# Patient Record
Sex: Female | Born: 1955
Health system: Southern US, Community
[De-identification: ages and names within clinical notes are randomized; demographics above are authoritative.]

## PROBLEM LIST (undated history)

## (undated) DIAGNOSIS — I10 Essential (primary) hypertension: Secondary | ICD-10-CM

## (undated) DIAGNOSIS — F32A Depression, unspecified: Secondary | ICD-10-CM

## (undated) DIAGNOSIS — F329 Major depressive disorder, single episode, unspecified: Secondary | ICD-10-CM

## (undated) HISTORY — PX: TONSILLECTOMY: SUR1361

## (undated) HISTORY — PX: BREAST SURGERY: SHX581

## (undated) HISTORY — PX: DILATION AND CURETTAGE OF UTERUS: SHX78

---

## 2004-07-29 ENCOUNTER — Emergency Department: Payer: Self-pay | Admitting: Emergency Medicine

## 2005-02-18 ENCOUNTER — Emergency Department: Payer: Self-pay | Admitting: Emergency Medicine

## 2007-01-14 ENCOUNTER — Emergency Department: Payer: Self-pay | Admitting: Emergency Medicine

## 2007-09-13 ENCOUNTER — Ambulatory Visit: Payer: Self-pay

## 2015-07-25 ENCOUNTER — Other Ambulatory Visit: Payer: Self-pay | Admitting: Nurse Practitioner

## 2015-07-25 DIAGNOSIS — Z1231 Encounter for screening mammogram for malignant neoplasm of breast: Secondary | ICD-10-CM

## 2015-08-25 ENCOUNTER — Ambulatory Visit: Payer: Medicare Other | Attending: Nurse Practitioner

## 2015-08-26 ENCOUNTER — Ambulatory Visit
Admission: RE | Admit: 2015-08-26 | Discharge: 2015-08-26 | Disposition: A | Discharge status: Home / Self Care | Payer: Medicare Other | Source: Ambulatory Visit | Attending: Nurse Practitioner | Admitting: Nurse Practitioner

## 2015-08-26 ENCOUNTER — Other Ambulatory Visit: Payer: Self-pay | Admitting: Nurse Practitioner

## 2015-08-26 DIAGNOSIS — F17200 Nicotine dependence, unspecified, uncomplicated: Secondary | ICD-10-CM | POA: Insufficient documentation

## 2015-08-26 DIAGNOSIS — R0902 Hypoxemia: Secondary | ICD-10-CM | POA: Insufficient documentation

## 2015-08-26 DIAGNOSIS — R918 Other nonspecific abnormal finding of lung field: Secondary | ICD-10-CM | POA: Diagnosis not present

## 2015-08-26 DIAGNOSIS — R600 Localized edema: Secondary | ICD-10-CM | POA: Insufficient documentation

## 2015-10-15 DIAGNOSIS — E669 Obesity, unspecified: Secondary | ICD-10-CM | POA: Insufficient documentation

## 2015-10-15 DIAGNOSIS — Z9981 Dependence on supplemental oxygen: Secondary | ICD-10-CM

## 2015-10-15 DIAGNOSIS — R0902 Hypoxemia: Secondary | ICD-10-CM | POA: Insufficient documentation

## 2015-10-15 DIAGNOSIS — I071 Rheumatic tricuspid insufficiency: Secondary | ICD-10-CM | POA: Insufficient documentation

## 2016-08-09 ENCOUNTER — Other Ambulatory Visit: Payer: Self-pay | Admitting: Nurse Practitioner

## 2016-08-09 DIAGNOSIS — Z1231 Encounter for screening mammogram for malignant neoplasm of breast: Secondary | ICD-10-CM

## 2017-05-19 ENCOUNTER — Other Ambulatory Visit: Payer: Self-pay | Admitting: Nurse Practitioner

## 2017-05-19 DIAGNOSIS — Z1231 Encounter for screening mammogram for malignant neoplasm of breast: Secondary | ICD-10-CM

## 2017-05-31 ENCOUNTER — Other Ambulatory Visit: Payer: Self-pay

## 2017-05-31 ENCOUNTER — Telehealth: Payer: Self-pay

## 2017-05-31 DIAGNOSIS — Z1211 Encounter for screening for malignant neoplasm of colon: Secondary | ICD-10-CM

## 2017-05-31 NOTE — Telephone Encounter (Signed)
Gastroenterology Pre-Procedure Review  Request Date:   Requesting Physician: Dr.    PATIENT REVIEW QUESTIONS: The patient responded to the following health history questions as indicated:    1. Are you having any GI issues? No  2. Do you have a personal history of Polyps? No  3. Do you have a family history of Colon Cancer or Polyps? No  4. Diabetes Mellitus? No  5. Joint replacements in the past 12 months? No  6. Major health problems in the past 3 months? No  7. Any artificial heart valves, MVP, or defibrillator? No     MEDICATIONS & ALLERGIES:    Patient reports the following regarding taking any anticoagulation/antiplatelet therapy:   Plavix, Coumadin, Eliquis, Xarelto, Lovenox, Pradaxa, Brilinta, or Effient? No  Aspirin? No   Patient confirms/reports the following medications:  Current Outpatient Medications  Medication Sig Dispense Refill  . albuterol (PROVENTIL HFA;VENTOLIN HFA) 108 (90 Base) MCG/ACT inhaler Inhale 2 puffs into the lungs every 4 (four) hours as needed for wheezing or shortness of breath.    . Buprenorphine HCl-Naloxone HCl (SUBOXONE) 8-2 MG FILM Take 1 Film by mouth daily.    Marland Kitchen FLUoxetine (PROZAC) 20 MG capsule Take 60 mg by mouth daily.    . furosemide (LASIX) 20 MG tablet Take 20 mg by mouth.    Marland Kitchen lisinopril (PRINIVIL,ZESTRIL) 5 MG tablet Take 5 mg by mouth daily.    . potassium chloride (KLOR-CON) 20 MEQ packet Take 20 mEq by mouth once.    . traZODone (DESYREL) 100 MG tablet Take 100 mg by mouth.    . zolpidem (AMBIEN) 10 MG tablet Take 20 mg by mouth at bedtime as needed.      No current facility-administered medications for this visit.     Patient confirms/reports the following allergies:  No Known Allergies  No orders of the defined types were placed in this encounter.   AUTHORIZATION INFORMATION Primary Insurance: 1D#: Group #:  Secondary Insurance: 1D#: Group #:  SCHEDULE INFORMATION: Date: 06/15/17 Time: Location: [place]

## 2017-06-15 ENCOUNTER — Ambulatory Visit: Payer: Medicare Other | Admitting: Anesthesiology

## 2017-06-15 ENCOUNTER — Ambulatory Visit
Admission: RE | Admit: 2017-06-15 | Discharge: 2017-06-15 | Disposition: A | Discharge status: Home / Self Care | Payer: Medicare Other | Source: Ambulatory Visit | Attending: Gastroenterology | Admitting: Gastroenterology

## 2017-06-15 ENCOUNTER — Encounter: Payer: Self-pay | Admitting: *Deleted

## 2017-06-15 ENCOUNTER — Encounter
Admission: RE | Disposition: A | Discharge status: Home / Self Care | Payer: Self-pay | Source: Ambulatory Visit | Attending: Gastroenterology

## 2017-06-15 ENCOUNTER — Other Ambulatory Visit: Payer: Self-pay

## 2017-06-15 DIAGNOSIS — K6389 Other specified diseases of intestine: Secondary | ICD-10-CM | POA: Diagnosis not present

## 2017-06-15 DIAGNOSIS — D123 Benign neoplasm of transverse colon: Secondary | ICD-10-CM | POA: Diagnosis not present

## 2017-06-15 DIAGNOSIS — Z1211 Encounter for screening for malignant neoplasm of colon: Secondary | ICD-10-CM | POA: Insufficient documentation

## 2017-06-15 DIAGNOSIS — Z79899 Other long term (current) drug therapy: Secondary | ICD-10-CM | POA: Diagnosis not present

## 2017-06-15 DIAGNOSIS — Z6831 Body mass index (BMI) 31.0-31.9, adult: Secondary | ICD-10-CM | POA: Diagnosis not present

## 2017-06-15 DIAGNOSIS — Z7951 Long term (current) use of inhaled steroids: Secondary | ICD-10-CM | POA: Diagnosis not present

## 2017-06-15 DIAGNOSIS — F329 Major depressive disorder, single episode, unspecified: Secondary | ICD-10-CM | POA: Insufficient documentation

## 2017-06-15 DIAGNOSIS — D122 Benign neoplasm of ascending colon: Secondary | ICD-10-CM | POA: Diagnosis not present

## 2017-06-15 DIAGNOSIS — K5730 Diverticulosis of large intestine without perforation or abscess without bleeding: Secondary | ICD-10-CM

## 2017-06-15 DIAGNOSIS — K635 Polyp of colon: Secondary | ICD-10-CM

## 2017-06-15 DIAGNOSIS — E669 Obesity, unspecified: Secondary | ICD-10-CM | POA: Insufficient documentation

## 2017-06-15 DIAGNOSIS — D125 Benign neoplasm of sigmoid colon: Secondary | ICD-10-CM | POA: Diagnosis not present

## 2017-06-15 DIAGNOSIS — I10 Essential (primary) hypertension: Secondary | ICD-10-CM | POA: Insufficient documentation

## 2017-06-15 DIAGNOSIS — F17200 Nicotine dependence, unspecified, uncomplicated: Secondary | ICD-10-CM | POA: Diagnosis not present

## 2017-06-15 HISTORY — DX: Major depressive disorder, single episode, unspecified: F32.9

## 2017-06-15 HISTORY — PX: COLONOSCOPY WITH PROPOFOL: SHX5780

## 2017-06-15 HISTORY — DX: Depression, unspecified: F32.A

## 2017-06-15 HISTORY — DX: Essential (primary) hypertension: I10

## 2017-06-15 SURGERY — COLONOSCOPY WITH PROPOFOL
Anesthesia: General

## 2017-06-15 MED ORDER — PROPOFOL 500 MG/50ML IV EMUL
INTRAVENOUS | Status: DC | PRN
  Administered 2017-06-15: 160 ug/kg/min via INTRAVENOUS

## 2017-06-15 MED ORDER — EPHEDRINE SULFATE 50 MG/ML IJ SOLN
INTRAMUSCULAR | Status: DC | PRN
  Administered 2017-06-15: 5 mg via INTRAVENOUS

## 2017-06-15 MED ORDER — SPOT INK MARKER SYRINGE KIT
PACK | SUBMUCOSAL | Status: DC | PRN
  Administered 2017-06-15: 2 mL via SUBMUCOSAL

## 2017-06-15 MED ORDER — PROPOFOL 10 MG/ML IV BOLUS
INTRAVENOUS | Status: DC | PRN
  Administered 2017-06-15: 70 mg via INTRAVENOUS

## 2017-06-15 MED ORDER — SODIUM CHLORIDE 0.9 % IV SOLN
INTRAVENOUS | Status: DC
  Administered 2017-06-15: 1000 mL via INTRAVENOUS

## 2017-06-15 NOTE — Anesthesia Post-op Follow-up Note (Signed)
Anesthesia QCDR form completed.        

## 2017-06-15 NOTE — Anesthesia Preprocedure Evaluation (Signed)
Anesthesia Evaluation  Patient identified by MRN, date of birth, ID band Patient awake    Reviewed: Allergy & Precautions, NPO status , Patient's Chart, lab work & pertinent test results  History of Anesthesia Complications Negative for: history of anesthetic complications  Airway Mallampati: III  TM Distance: >3 FB Neck ROM: Full    Dental  (+) Upper Dentures, Lower Dentures   Pulmonary neg sleep apnea, COPD,  COPD inhaler, Current Smoker,    breath sounds clear to auscultation- rhonchi (-) wheezing      Cardiovascular Exercise Tolerance: Good hypertension, Pt. on medications (-) CAD, (-) Past MI, (-) Cardiac Stents and (-) CABG  Rhythm:Regular Rate:Normal - Systolic murmurs and - Diastolic murmurs    Neuro/Psych PSYCHIATRIC DISORDERS Depression negative neurological ROS     GI/Hepatic negative GI ROS, Neg liver ROS,   Endo/Other  negative endocrine ROSneg diabetes  Renal/GU negative Renal ROS     Musculoskeletal negative musculoskeletal ROS (+)   Abdominal (+) + obese,   Peds  Hematology negative hematology ROS (+)   Anesthesia Other Findings Past Medical History: No date: Depression No date: Hypertension   Reproductive/Obstetrics                             Anesthesia Physical Anesthesia Plan  ASA: II  Anesthesia Plan: General   Post-op Pain Management:    Induction: Intravenous  PONV Risk Score and Plan: 1 and Propofol infusion  Airway Management Planned: Natural Airway  Additional Equipment:   Intra-op Plan:   Post-operative Plan:   Informed Consent: I have reviewed the patients History and Physical, chart, labs and discussed the procedure including the risks, benefits and alternatives for the proposed anesthesia with the patient or authorized representative who has indicated his/her understanding and acceptance.   Dental advisory given  Plan Discussed with: CRNA  and Anesthesiologist  Anesthesia Plan Comments:         Anesthesia Quick Evaluation

## 2017-06-15 NOTE — Op Note (Addendum)
[facility] Patient Name: Lakiyah Arntson Procedure Date: 06/15/2017 11:04 AM MRN: [id number] Account #: [id number] Date of Birth: 1955-10-12 Admit Type: Outpatient Age: 62 Room: [facility] ENDO ROOM 2 Gender: Female Note Status: Finalized Procedure:            Colonoscopy Indications:          Screening for colorectal malignant neoplasm Providers:            Jawon Dipiero B. Bonna Gains MD, MD Referring MD:         Forest Gleason Md, MD (Referring MD) Medicines:            Monitored Anesthesia Care Complications:        No immediate complications. Procedure:            Pre-Anesthesia Assessment:                       - ASA Grade Assessment: II - A patient with mild                        systemic disease.                       - Prior to the procedure, a History and Physical was                        performed, and patient medications, allergies and                        sensitivities were reviewed. The patient's tolerance of                        previous anesthesia was reviewed.                       - The risks and benefits of the procedure and the                        sedation options and risks were discussed with the                        patient. All questions were answered and informed                        consent was obtained.                       - Patient identification and proposed procedure were                        verified prior to the procedure by the physician, the                        nurse, the anesthesiologist, the anesthetist and the                        technician. The procedure was verified in the procedure                        room.  After obtaining informed consent, the colonoscope was                        passed under direct vision. Throughout the procedure,                        the patient's blood pressure, pulse, and oxygen                        saturations were monitored continuously. The                  Colonoscope was introduced through the anus and                        advanced to the the cecum, identified by appendiceal                        orifice and ileocecal valve. The colonoscopy was                        performed with ease. The patient tolerated the                        procedure well. The quality of the bowel preparation                        was good. Findings:      The perianal and digital rectal examinations were normal.      A 5 mm polyp was found in the ascending colon. The polyp was flat. The       polyp was removed with a jumbo cold forceps. Resection and retrieval       were complete. Area was tattooed with an injection of 2 mL of Spot       (carbon black).      A 3 mm polyp was found in the transverse colon. The polyp was sessile.       The polyp was removed with a jumbo cold forceps. Resection and retrieval       were complete.      A 4 mm polyp was found in the sigmoid colon. The polyp was sessile. The       polyp was removed with a cold snare. Resection and retrieval were       complete.      A localized area of mildly erythematous and thickened folds of the       mucosa was found in the sigmoid colon. Biopsies were taken with a cold       forceps for histology.      Multiple small-mouthed diverticula were found in the sigmoid colon.      The exam was otherwise without abnormality.      The retroflexed view of the distal rectum and anal verge was normal and       showed no anal or rectal abnormalities. Impression:           - One 5 mm polyp in the ascending colon, removed with a                        jumbo cold forceps. Resected and retrieved. Tattooed.                       -  One 3 mm polyp in the transverse colon, removed with                        a jumbo cold forceps. Resected and retrieved.                       - One 4 mm polyp in the sigmoid colon, removed with a                        cold snare. Resected and retrieved.                        - Erythematous and thickened folds of the mucosa in the                        sigmoid colon. Biopsied.                       - Diverticulosis in the sigmoid colon.                       - The examination was otherwise normal.                       - The distal rectum and anal verge are normal on                        retroflexion view. Recommendation:       - Discharge patient to home (with escort).                       - Advance diet as tolerated.                       - Continue present medications.                       - Await pathology results.                       - Repeat colonoscopy in 3 - 5 years for surveillance.                        Patient will be updated after biopsy results.                       - The findings and recommendations were discussed with                        the patient.                       - The findings and recommendations were discussed with                        the patient's family.                       - Return to primary care physician as previously                        scheduled.                       -  High fiber diet. Procedure Code(s):    --- Professional ---                       [phone], Colonoscopy, flexible; with removal of tumor(s),                        polyp(s), or other lesion(s) by snare technique                       [id number], Colonoscopy, flexible; with directed submucosal                        injection(s), any substance                       45409, 34, Colonoscopy, flexible; with biopsy, single                        or multiple Diagnosis Code(s):    --- Professional ---                       Z12.11, Encounter for screening for malignant neoplasm                        of colon                       D12.2, Benign neoplasm of ascending colon                       D12.3, Benign neoplasm of transverse colon (hepatic                        flexure or splenic flexure)                       D12.5, Benign neoplasm of  sigmoid colon                       K63.89, Other specified diseases of intestine                       K57.30, Diverticulosis of large intestine without                        perforation or abscess without bleeding CPT copyright 2016 American Medical Association. All rights reserved. The codes documented in this report are preliminary and upon coder review may  be revised to meet current compliance requirements.  Vonda Antigua, MD Margretta Sidle B. Bonna Gains MD, MD 06/15/2017 12:12:44 PM This report has been signed electronically. Number of Addenda: 0 Note Initiated On: 06/15/2017 11:04 AM Scope Withdrawal Time: 0 hours 37 minutes 35 seconds  Total Procedure Duration: 0 hours 49 minutes 45 seconds  Estimated Blood Loss: Estimated blood loss: none.      [facility]

## 2017-06-15 NOTE — Transfer of Care (Signed)
Immediate Anesthesia Transfer of Care Note  Patient: Andrea Hobbs  Procedure(s) Performed: COLONOSCOPY WITH PROPOFOL (N/A )  Patient Location: PACU  Anesthesia Type:General  Level of Consciousness: awake, alert  and oriented  Airway & Oxygen Therapy: Patient Spontanous Breathing  Post-op Assessment: Report given to RN and Post -op Vital signs reviewed and stable  Post vital signs: Reviewed and stable  Last Vitals:  Vitals:   06/15/17 1040 06/15/17 1211  BP: 104/71   Pulse: (!) 59 62  Resp: 18 15  Temp: (!) 36.2 C (!) 36.3 C  SpO2: 99% 94%    Last Pain:  Vitals:   06/15/17 1040  TempSrc: Tympanic         Complications: No apparent anesthesia complications

## 2017-06-15 NOTE — H&P (Signed)
Andrea Antigua, MD [address] [place], [place], [place], [address] [address], [place], [place], [place], [address] Phone: [phone]  Fax: [phone]  Primary Care Physician:  [facility] [place]   Pre-Procedure History & Physical: HPI:  Andrea Hobbs is a 62 y.o. female is here for a colonoscopy.   Past Medical History:  Diagnosis Date  . Depression   . Hypertension     Past Surgical History:  Procedure Laterality Date  . BREAST SURGERY Right    benign breast lump removed  . DILATION AND CURETTAGE OF UTERUS    . TONSILLECTOMY      Prior to Admission medications   Medication Sig Start Date End Date Taking? Authorizing Provider  albuterol (PROVENTIL HFA;VENTOLIN HFA) 108 (90 Base) MCG/ACT inhaler Inhale 2 puffs into the lungs every 4 (four) hours as needed for wheezing or shortness of breath.   Yes [provider]  Buprenorphine HCl-Naloxone HCl (SUBOXONE) 8-2 MG FILM Take 1 Film by mouth daily. 03/21/15  Yes [provider]  FLUoxetine (PROZAC) 20 MG capsule Take 60 mg by mouth daily. 01/03/15  Yes [provider]  furosemide (LASIX) 20 MG tablet Take 20 mg by mouth.   Yes [provider]  lisinopril (PRINIVIL,ZESTRIL) 5 MG tablet Take 5 mg by mouth daily.   Yes [provider]  potassium chloride (KLOR-CON) 20 MEQ packet Take 20 mEq by mouth once.   Yes [provider]  traZODone (DESYREL) 100 MG tablet Take 100 mg by mouth. 12/08/10  Yes [provider]  zolpidem (AMBIEN) 10 MG tablet Take 20 mg by mouth at bedtime as needed.  11/23/14  Yes [provider]    Allergies as of 05/31/2017  . (No Known Allergies)    History reviewed. No pertinent family history.  Social History   Socioeconomic History  . Marital status: Divorced    Spouse name: Not on file  . Number of children: Not on file  . Years of education: Not on file  . Highest education level: Not on file  Social  Needs  . Financial resource strain: Not on file  . Food insecurity - worry: Not on file  . Food insecurity - inability: Not on file  . Transportation needs - medical: Not on file  . Transportation needs - non-medical: Not on file  Occupational History  . Not on file  Tobacco Use  . Smoking status: Current Every Day Smoker  . Smokeless tobacco: Never Used  Substance and Sexual Activity  . Alcohol use: No    Frequency: Never  . Drug use: No  . Sexual activity: Not on file  Other Topics Concern  . Not on file  Social History Narrative  . Not on file    Review of Systems: See HPI, otherwise negative ROS  Physical Exam: BP 104/71   Pulse (!) 59   Temp (!) 97.2 F (36.2 C) (Tympanic)   Resp 18   Ht 5\' 3"  (1.6 m)   Wt 175 lb (79.4 kg)   SpO2 99%   BMI 31.00 kg/m  General:   Alert,  pleasant and cooperative in NAD Head:  Normocephalic and atraumatic. Neck:  Supple; no masses or thyromegaly. Lungs:  Clear throughout to auscultation, normal respiratory effort.    Heart:  +S1, +S2, Regular rate and rhythm, No edema. Abdomen:  Soft, nontender and nondistended. Normal bowel sounds, without guarding, and without rebound.   Neurologic:  Alert and  oriented x4;  grossly normal  neurologically.  Impression/Plan: Andrea Hobbs is here for a colonoscopy to be performed for average risk screening.  Risks, benefits, limitations, and alternatives regarding  colonoscopy have been reviewed with the patient.  Questions have been answered.  All parties agreeable.   Virgel Manifold, MD  06/15/2017, 10:47 AM

## 2017-06-15 NOTE — Anesthesia Postprocedure Evaluation (Signed)
Anesthesia Post Note  Patient: Anayelli Galik  Procedure(s) Performed: COLONOSCOPY WITH PROPOFOL (N/A )  Patient location during evaluation: Endoscopy Anesthesia Type: General Level of consciousness: awake and alert and oriented Pain management: pain level controlled Vital Signs Assessment: post-procedure vital signs reviewed and stable Respiratory status: spontaneous breathing, nonlabored ventilation and respiratory function stable Cardiovascular status: blood pressure returned to baseline and stable Postop Assessment: no signs of nausea or vomiting Anesthetic complications: no     Last Vitals:  Vitals:   06/15/17 1211 06/15/17 1227  BP:    Pulse: 62   Resp: 15   Temp: (!) 36.3 C   SpO2: 94% 90%    Last Pain:  Vitals:   06/15/17 1207  TempSrc: Tympanic                 Bridgid Printz

## 2017-06-16 ENCOUNTER — Encounter: Payer: Self-pay | Admitting: Gastroenterology

## 2017-06-17 LAB — SURGICAL PATHOLOGY

## 2017-07-11 ENCOUNTER — Ambulatory Visit
Admission: RE | Admit: 2017-07-11 | Discharge: 2017-07-11 | Disposition: A | Discharge status: Home / Self Care | Payer: Medicare Other | Attending: Family Medicine | Admitting: Family Medicine

## 2017-07-27 ENCOUNTER — Ambulatory Visit (INDEPENDENT_AMBULATORY_CARE_PROVIDER_SITE_OTHER): Payer: Medicare Other | Admitting: Gastroenterology

## 2017-07-27 ENCOUNTER — Encounter: Payer: Self-pay | Admitting: Gastroenterology

## 2017-07-27 ENCOUNTER — Encounter (INDEPENDENT_AMBULATORY_CARE_PROVIDER_SITE_OTHER): Payer: Self-pay

## 2017-07-27 VITALS — BP 102/63 | HR 82 | Temp 98.1°F | Wt 174.8 lb

## 2017-07-27 DIAGNOSIS — B182 Chronic viral hepatitis C: Secondary | ICD-10-CM

## 2017-07-27 DIAGNOSIS — Z86010 Personal history of colonic polyps: Secondary | ICD-10-CM

## 2017-07-27 NOTE — Progress Notes (Signed)
Vonda Antigua, MD [address]  [place]  [place], [place] [address]  Main: [phone]  Fax: [phone]   Primary Care Physician: [facility]  Primary Gastroenterologist:  Dr. Vonda Antigua  Chief complaint: Follow-up of colonoscopy and biopsies  HPI: Andrea Hobbs is a 62 y.o. femalePatient had an average risk screening colonoscopy on June 15, 2017 when she was referred by her primary care provider.  As detailed below, 3 polyps were removed completely from her colon.  The one in the ascending colon was tattooed as it was removed piecemeal via jumbo cold forceps.  Biopsies of the polyps Showed tubular adenoma.  There was a thickened erythematous fold in the sigmoid colon that was biopsied.  The biopsies of which are showing sessile serrated adenoma.  Patient is here to discuss biopsy results and need for repeat colonoscopy.  No abdominal pain, no blood in stool, no nausea vomiting or weight loss.  Has history of hepatitis C, treated by [facility] in the past.  No recent drug use.  Impression:           - One 5 mm polyp in the ascending colon, removed with a                        jumbo cold forceps. Resected and retrieved. Tattooed.                       - One 3 mm polyp in the transverse colon, removed with                        a jumbo cold forceps. Resected and retrieved.                       - One 4 mm polyp in the sigmoid colon, removed with a                        cold snare. Resected and retrieved.                       - Erythematous and thickened folds of the mucosa in the                        sigmoid colon. Biopsied.                       - Diverticulosis in the sigmoid colon.                       - The examination was otherwise normal.                       - The distal rectum and anal verge are normal on                        retroflexion view.   DIAGNOSIS:  A. COLON POLYP, ASCENDING; COLD BIOPSY:  - TUBULAR ADENOMA, 4  FRAGMENTS.  - NEGATIVE FOR HIGH-GRADE DYSPLASIA AND MALIGNANCY.   B. COLON POLYP, TRANSVERSE AND SIGMOID; COLD BIOPSY:  - TUBULAR ADENOMAS, 2 FRAGMENTS.  - NEGATIVE FOR HIGH-GRADE DYSPLASIA AND MALIGNANCY.   C. COLON POLYP VS LESION, SIGMOID; COLD BIOPSY:  - SESSILE SERRATED ADENOMA.  -  NEGATIVE FOR CYTOLOGIC DYSPLASIA AND MALIGNANCY.   Current Outpatient Medications  Medication Sig Dispense Refill  . albuterol (PROVENTIL HFA;VENTOLIN HFA) 108 (90 Base) MCG/ACT inhaler Inhale 2 puffs into the lungs every 4 (four) hours as needed for wheezing or shortness of breath.    . Buprenorphine HCl-Naloxone HCl (SUBOXONE) 8-2 MG FILM Take 1 Film by mouth daily.    Marland Kitchen FLUoxetine (PROZAC) 20 MG capsule Take 60 mg by mouth daily.    . furosemide (LASIX) 20 MG tablet Take 20 mg by mouth.    Marland Kitchen lisinopril (PRINIVIL,ZESTRIL) 5 MG tablet Take 5 mg by mouth daily.    . potassium chloride (KLOR-CON) 20 MEQ packet Take 20 mEq by mouth once.    . traZODone (DESYREL) 100 MG tablet Take 100 mg by mouth.    . zolpidem (AMBIEN) 10 MG tablet Take 20 mg by mouth at bedtime as needed.      No current facility-administered medications for this visit.     Allergies as of 07/27/2017  . (No Known Allergies)    ROS:  General: Negative for anorexia, weight loss, fever, chills, fatigue, weakness. ENT: Negative for hoarseness, difficulty swallowing , nasal congestion. CV: Negative for chest pain, angina, palpitations, dyspnea on exertion, peripheral edema.  Respiratory: Negative for dyspnea at rest, dyspnea on exertion, cough, sputum, wheezing.  GI: See history of present illness. GU:  Negative for dysuria, hematuria, urinary incontinence, urinary frequency, nocturnal urination.  Endo: Negative for unusual weight change.    Physical Examination:   There were no vitals taken for this visit.  General: Well-nourished, well-developed in no acute distress.  Eyes: No icterus. Conjunctivae pink. Mouth: Oropharyngeal  mucosa moist and pink , no lesions erythema or exudate. Neck: Supple, Trachea midline Abdomen: Bowel sounds are normal, nontender, nondistended, no hepatosplenomegaly or masses, no abdominal bruits or hernia , no rebound or guarding.   Extremities: No lower extremity edema. No clubbing or deformities. Neuro: Alert and oriented x 3.  Grossly intact. Skin: Warm and dry, no jaundice.   Psych: Alert and cooperative, normal mood and affect.   Labs: CMP  No results found for: NA, K, CL, CO2, GLUCOSE, BUN, CREATININE, CALCIUM, PROT, ALBUMIN, AST, ALT, ALKPHOS, BILITOT, GFRNONAA, GFRAA No results found for: WBC, HGB, HCT, MCV, PLT  Imaging Studies: No results found.  Assessment and Plan:   Andrea Hobbs is a 62 y.o. y/o female here for follow-up of post colonoscopy biopsy results  A thickened fold in her sigmoid colon that was biopsied, showed sessile serrated adenoma She will need repeat colonoscopy to reevaluate the area I have discussed this extensively with her, and she is agreeable with the procedure We will schedule for repeat colonoscopy in 2-3 months. will also reevaluate previously placed tattoo site in the descending colon At that time  -History of hepatitis C I have reviewed her previous notes She was seen at [facility] liver clinic, for hepatitis C She was treated in 2017 and achieved SVR Genotype 1a As per their notes from March 03, 2016 "Liver Care:  1. Chronic hepatitis C, treatment naive, genotype 1a Baseline HCV RNA 1,731,650 IU/ml on 01/21/2014 HCV RNA TW #6: Detected < 12iu/ml HCV RNA EOT Detected <12 Three Month Post Treatment HCV RNA UNDETECTED 2. Immunizations completed Twinrix series  4. HIV status: non-reactive 5. FibroScan: 8.3 kPa/F2" They did not recommend any further follow-up with them at that time.  We will recheck HCVRNA to ensure it is undetectable at this time   Dr Vonda Antigua

## 2017-07-29 NOTE — Addendum Note (Signed)
Addended by: Earl Lagos on: 07/29/2017 08:38 AM   Modules accepted: Orders

## 2017-08-19 ENCOUNTER — Telehealth: Payer: Self-pay

## 2017-08-19 NOTE — Telephone Encounter (Signed)
Recall letter was sent out 08/05/17. Will contact pt. Also.

## 2017-08-19 NOTE — Telephone Encounter (Signed)
-----   Message from Virgel Manifold, MD sent at 08/07/2017  5:21 PM EDT ----- She was supposed to be scheduled for a repeat colonoscopy in 2-3 months? Just checking to see what happened.

## 2017-08-31 ENCOUNTER — Telehealth: Payer: Self-pay

## 2017-08-31 NOTE — Telephone Encounter (Signed)
[facility] to schedule her repeat colonoscopy for June or July.

## 2017-09-07 ENCOUNTER — Other Ambulatory Visit: Payer: Self-pay

## 2017-09-07 DIAGNOSIS — Z86010 Personal history of colonic polyps: Secondary | ICD-10-CM

## 2017-09-07 MED ORDER — PEG 3350-KCL-NABCB-NACL-NASULF 236 G PO SOLR: 4000.0000 mL | Freq: Once | ORAL | 0 refills | Status: AC

## 2017-09-07 NOTE — Progress Notes (Signed)
Repeat colonoscopy advised by Dr. Darene Lamer.   Spoke to patient and scheduled. Mailed letter. Sent Rx to pharmacy.

## 2017-10-24 ENCOUNTER — Encounter: Payer: Self-pay | Admitting: *Deleted

## 2017-10-24 ENCOUNTER — Encounter
Admission: RE | Disposition: A | Discharge status: Home / Self Care | Payer: Self-pay | Source: Ambulatory Visit | Attending: Gastroenterology

## 2017-10-24 ENCOUNTER — Ambulatory Visit: Payer: Medicare Other | Admitting: Certified Registered"

## 2017-10-24 ENCOUNTER — Ambulatory Visit
Admission: RE | Admit: 2017-10-24 | Discharge: 2017-10-24 | Disposition: A | Discharge status: Home / Self Care | Payer: Medicare Other | Source: Ambulatory Visit | Attending: Gastroenterology | Admitting: Gastroenterology

## 2017-10-24 DIAGNOSIS — Z6830 Body mass index (BMI) 30.0-30.9, adult: Secondary | ICD-10-CM | POA: Insufficient documentation

## 2017-10-24 DIAGNOSIS — Z1211 Encounter for screening for malignant neoplasm of colon: Secondary | ICD-10-CM | POA: Diagnosis not present

## 2017-10-24 DIAGNOSIS — I10 Essential (primary) hypertension: Secondary | ICD-10-CM | POA: Diagnosis not present

## 2017-10-24 DIAGNOSIS — K635 Polyp of colon: Secondary | ICD-10-CM | POA: Diagnosis not present

## 2017-10-24 DIAGNOSIS — E669 Obesity, unspecified: Secondary | ICD-10-CM | POA: Diagnosis not present

## 2017-10-24 DIAGNOSIS — F329 Major depressive disorder, single episode, unspecified: Secondary | ICD-10-CM | POA: Diagnosis not present

## 2017-10-24 DIAGNOSIS — Z860100 Personal history of colon polyps, unspecified: Secondary | ICD-10-CM

## 2017-10-24 DIAGNOSIS — Z79899 Other long term (current) drug therapy: Secondary | ICD-10-CM | POA: Insufficient documentation

## 2017-10-24 DIAGNOSIS — J449 Chronic obstructive pulmonary disease, unspecified: Secondary | ICD-10-CM | POA: Diagnosis not present

## 2017-10-24 DIAGNOSIS — D125 Benign neoplasm of sigmoid colon: Secondary | ICD-10-CM

## 2017-10-24 DIAGNOSIS — F17210 Nicotine dependence, cigarettes, uncomplicated: Secondary | ICD-10-CM | POA: Diagnosis not present

## 2017-10-24 DIAGNOSIS — K5730 Diverticulosis of large intestine without perforation or abscess without bleeding: Secondary | ICD-10-CM | POA: Diagnosis not present

## 2017-10-24 HISTORY — PX: COLONOSCOPY WITH PROPOFOL: SHX5780

## 2017-10-24 SURGERY — COLONOSCOPY WITH PROPOFOL
Anesthesia: General

## 2017-10-24 MED ORDER — LIDOCAINE HCL (CARDIAC) PF 100 MG/5ML IV SOSY
PREFILLED_SYRINGE | INTRAVENOUS | Status: DC | PRN
  Administered 2017-10-24: 50 mg via INTRAVENOUS

## 2017-10-24 MED ORDER — PROPOFOL 500 MG/50ML IV EMUL
INTRAVENOUS | Status: AC
  Filled 2017-10-24: qty 50

## 2017-10-24 MED ORDER — PROPOFOL 500 MG/50ML IV EMUL
INTRAVENOUS | Status: DC | PRN
  Administered 2017-10-24: 130 ug/kg/min via INTRAVENOUS

## 2017-10-24 MED ORDER — PROPOFOL 10 MG/ML IV BOLUS
INTRAVENOUS | Status: DC | PRN
  Administered 2017-10-24: 70 mg via INTRAVENOUS

## 2017-10-24 MED ORDER — SODIUM CHLORIDE 0.9 % IV SOLN
INTRAVENOUS | Status: DC
  Administered 2017-10-24: 13:00:00 via INTRAVENOUS

## 2017-10-24 NOTE — Anesthesia Postprocedure Evaluation (Signed)
Anesthesia Post Note  Patient: Andrea Hobbs  Procedure(s) Performed: COLONOSCOPY WITH PROPOFOL (N/A )  Patient location during evaluation: Endoscopy Anesthesia Type: General Level of consciousness: awake and alert Pain management: pain level controlled Vital Signs Assessment: post-procedure vital signs reviewed and stable Respiratory status: spontaneous breathing, nonlabored ventilation, respiratory function stable and patient connected to nasal cannula oxygen Cardiovascular status: blood pressure returned to baseline and stable Postop Assessment: no apparent nausea or vomiting Anesthetic complications: no     Last Vitals:  Vitals:   10/24/17 1405 10/24/17 1413  BP: 112/62 110/84  Pulse: (!) 52   Resp: 13   Temp:    SpO2: 97%     Last Pain:  Vitals:   10/24/17 1413  TempSrc:   PainSc: 0-No pain                 Martha Clan

## 2017-10-24 NOTE — Anesthesia Preprocedure Evaluation (Signed)
Anesthesia Evaluation  Patient identified by MRN, date of birth, ID band Patient awake    Reviewed: Allergy & Precautions, NPO status , Patient's Chart, lab work & pertinent test results  History of Anesthesia Complications Negative for: history of anesthetic complications  Airway Mallampati: III  TM Distance: >3 FB Neck ROM: Full    Dental  (+) Upper Dentures, Lower Dentures   Pulmonary neg sleep apnea, COPD,  COPD inhaler, Current Smoker,    breath sounds clear to auscultation- rhonchi (-) wheezing      Cardiovascular Exercise Tolerance: Good hypertension, Pt. on medications (-) CAD, (-) Past MI, (-) Cardiac Stents and (-) CABG  Rhythm:Regular Rate:Normal - Systolic murmurs and - Diastolic murmurs    Neuro/Psych PSYCHIATRIC DISORDERS Depression negative neurological ROS     GI/Hepatic negative GI ROS, Neg liver ROS,   Endo/Other  negative endocrine ROSneg diabetes  Renal/GU negative Renal ROS     Musculoskeletal negative musculoskeletal ROS (+)   Abdominal (+) + obese,   Peds  Hematology negative hematology ROS (+)   Anesthesia Other Findings Past Medical History: No date: Depression No date: Hypertension   Reproductive/Obstetrics                             Anesthesia Physical  Anesthesia Plan  ASA: II  Anesthesia Plan: General   Post-op Pain Management:    Induction: Intravenous  PONV Risk Score and Plan: 1 and Propofol infusion  Airway Management Planned: Natural Airway and Nasal Cannula  Additional Equipment:   Intra-op Plan:   Post-operative Plan:   Informed Consent: I have reviewed the patients History and Physical, chart, labs and discussed the procedure including the risks, benefits and alternatives for the proposed anesthesia with the patient or authorized representative who has indicated his/her understanding and acceptance.   Dental advisory given  Plan  Discussed with: CRNA and Anesthesiologist  Anesthesia Plan Comments:         Anesthesia Quick Evaluation

## 2017-10-24 NOTE — H&P (Signed)
Vonda Antigua, MD [address], [place], [place], [place], [address] [address], [place], [place], [place], [address] Phone: [phone]  Fax: [phone]  Primary Care Physician:  [facility]   Pre-Procedure History & Physical: HPI:  Andrea Hobbs is a 62 y.o. female is here for a colonoscopy.   Past Medical History:  Diagnosis Date  . Depression   . Hypertension     Past Surgical History:  Procedure Laterality Date  . BREAST SURGERY Right    benign breast lump removed  . COLONOSCOPY WITH PROPOFOL N/A 06/15/2017   Procedure: COLONOSCOPY WITH PROPOFOL;  Surgeon: Virgel Manifold, MD;  Location: [facility] ENDOSCOPY;  Service: Endoscopy;  Laterality: N/A;  . DILATION AND CURETTAGE OF UTERUS    . TONSILLECTOMY      Prior to Admission medications   Medication Sig Start Date End Date Taking? Authorizing Provider  albuterol (PROVENTIL HFA;VENTOLIN HFA) 108 (90 Base) MCG/ACT inhaler Inhale 2 puffs into the lungs every 4 (four) hours as needed for wheezing or shortness of breath.   Yes [provider]  Buprenorphine HCl-Naloxone HCl (SUBOXONE) 8-2 MG FILM Take 1 Film by mouth daily. 03/21/15  Yes [provider]  FLUoxetine (PROZAC) 20 MG capsule Take 60 mg by mouth daily. 01/03/15  Yes [provider]  furosemide (LASIX) 20 MG tablet Take 20 mg by mouth.   Yes [provider]  lisinopril (PRINIVIL,ZESTRIL) 5 MG tablet Take 5 mg by mouth daily.   Yes [provider]  potassium chloride SA (K-DUR,KLOR-CON) 20 MEQ tablet  06/27/17  Yes [provider]  pseudoephedrine (SUDAFED) 30 MG tablet Take 30 mg by mouth every 4 (four) hours as needed for congestion.   Yes [provider]  traZODone (DESYREL) 100 MG tablet Take 100 mg by mouth. 12/08/10  Yes [provider]  zolpidem (AMBIEN) 10 MG tablet Take 20 mg by mouth at bedtime as needed.  11/23/14  Yes [provider]  potassium  chloride (KLOR-CON) 20 MEQ packet Take 20 mEq by mouth once.    [provider]    Allergies as of 09/07/2017  . (No Known Allergies)    History reviewed. No pertinent family history.  Social History   Socioeconomic History  . Marital status: Divorced    Spouse name: Not on file  . Number of children: Not on file  . Years of education: Not on file  . Highest education level: Not on file  Occupational History  . Not on file  Social Needs  . Financial resource strain: Not on file  . Food insecurity:    Worry: Not on file    Inability: Not on file  . Transportation needs:    Medical: Not on file    Non-medical: Not on file  Tobacco Use  . Smoking status: Current Every Day Smoker    Packs/day: 0.50    Types: Cigarettes  . Smokeless tobacco: Never Used  Substance and Sexual Activity  . Alcohol use: No    Frequency: Never  . Drug use: No  . Sexual activity: Not on file  Lifestyle  . Physical activity:    Days per week: Not on file    Minutes per session: Not on file  . Stress: Not on file  Relationships  . Social connections:    Talks on phone: Not on file    Gets together: Not on file    Attends religious service: Not on file    Active member  of club or organization: Not on file    Attends meetings of clubs or organizations: Not on file    Relationship status: Not on file  . Intimate partner violence:    Fear of current or ex partner: Not on file    Emotionally abused: Not on file    Physically abused: Not on file    Forced sexual activity: Not on file  Other Topics Concern  . Not on file  Social History Narrative  . Not on file    Review of Systems: See HPI, otherwise negative ROS  Physical Exam: BP 127/69   Pulse (!) 53   Temp 97.9 F (36.6 C) (Tympanic)   Resp 18   Ht 5\' 2"  (1.575 m)   Wt 165 lb (74.8 kg)   SpO2 94%   BMI 30.18 kg/m  General:   Alert,  pleasant and cooperative in NAD Head:  Normocephalic and atraumatic. Neck:   Supple; no masses or thyromegaly. Lungs:  Clear throughout to auscultation, normal respiratory effort.    Heart:  +S1, +S2, Regular rate and rhythm, No edema. Abdomen:  Soft, nontender and nondistended. Normal bowel sounds, without guarding, and without rebound.   Neurologic:  Alert and  oriented x4;  grossly normal neurologically.  Impression/Plan: Andrea Hobbs is here for a colonoscopy to be performed for polyp surveillance.  Risks, benefits, limitations, and alternatives regarding  colonoscopy have been reviewed with the patient.  Questions have been answered.  All parties agreeable.   Virgel Manifold, MD  10/24/2017, 1:09 PM

## 2017-10-24 NOTE — Anesthesia Post-op Follow-up Note (Signed)
Anesthesia QCDR form completed.        

## 2017-10-24 NOTE — Anesthesia Procedure Notes (Signed)
Performed by: Ritik Stavola, CRNA Pre-anesthesia Checklist: Patient identified, Emergency Drugs available, Suction available, Patient being monitored and Timeout performed Patient Re-evaluated:Patient Re-evaluated prior to induction Oxygen Delivery Method: Nasal cannula Induction Type: IV induction       

## 2017-10-24 NOTE — Transfer of Care (Signed)
Immediate Anesthesia Transfer of Care Note  Patient: Andrea Hobbs  Procedure(s) Performed: COLONOSCOPY WITH PROPOFOL (N/A )  Patient Location: PACU  Anesthesia Type:General  Level of Consciousness: sedated  Airway & Oxygen Therapy: Patient Spontanous Breathing and Patient connected to nasal cannula oxygen  Post-op Assessment: Report given to RN and Post -op Vital signs reviewed and stable  Post vital signs: Reviewed and stable  Last Vitals:  Vitals Value Taken Time  BP 112/62 10/24/2017  2:05 PM  Temp 36.1 C 10/24/2017  2:04 PM  Pulse 52 10/24/2017  2:05 PM  Resp 13 10/24/2017  2:05 PM  SpO2 97 % 10/24/2017  2:05 PM    Last Pain:  Vitals:   10/24/17 1404  TempSrc: Tympanic  PainSc: 0-No pain         Complications: No apparent anesthesia complications

## 2017-10-24 NOTE — Op Note (Signed)
[facility] Patient Name: Andrea Hobbs Procedure Date: 10/24/2017 11:31 AM MRN: [id number] Account #: [id number] Date of Birth: August 11, 1955 Admit Type: Outpatient Age: 62 Room: [facility] ENDO ROOM 4 Gender: Female Note Status: Finalized Procedure:            Colonoscopy Indications:          High risk colon cancer surveillance: Personal history                        of colonic polyps Providers:            Mykira Hofmeister B. Bonna Gains MD, MD Referring MD:         Elisabeth Cara, NP Medicines:            Monitored Anesthesia Care Complications:        No immediate complications. Procedure:            Pre-Anesthesia Assessment:                       - ASA Grade Assessment: II - A patient with mild                        systemic disease.                       - Prior to the procedure, a History and Physical was                        performed, and patient medications, allergies and                        sensitivities were reviewed. The patient's tolerance of                        previous anesthesia was reviewed.                       - The risks and benefits of the procedure and the                        sedation options and risks were discussed with the                        patient. All questions were answered and informed                        consent was obtained.                       - Patient identification and proposed procedure were                        verified prior to the procedure by the physician, the                        nurse, the anesthesiologist, the anesthetist and the                        technician. The procedure was verified in the procedure  room.                       After obtaining informed consent, the colonoscope was                        passed under direct vision. Throughout the procedure,                        the patient's blood pressure, pulse, and oxygen                        saturations  were monitored continuously. The                        Colonoscope was introduced through the anus and                        advanced to the the cecum, identified by appendiceal                        orifice and ileocecal valve. The colonoscopy was                        performed with ease. The patient tolerated the                        procedure well. The quality of the bowel preparation                        was good. Findings:      The perianal and digital rectal examinations were normal.      A tattoo was seen in the ascending colon. The tattoo site appeared       normal.      A 5 mm polyp was found in the sigmoid colon. The polyp was sessile. The       polyp was removed with a cold biopsy forceps. Resection and retrieval       were complete.      Two diverticula were found in the colon.      The exam was otherwise without abnormality.      The rectum, sigmoid colon, descending colon, transverse colon, ascending       colon and cecum appeared normal.      The retroflexed view of the distal rectum and anal verge was normal and       showed no anal or rectal abnormalities. Impression:           - A tattoo was seen in the ascending colon. The tattoo                        site appeared normal.                       - One 5 mm polyp in the sigmoid colon, removed with a                        cold biopsy forceps. Resected and retrieved.                       - Diverticulosis.                       -  The examination was otherwise normal.                       - The rectum, sigmoid colon, descending colon,                        transverse colon, ascending colon and cecum are normal.                       - The distal rectum and anal verge are normal on                        retroflexion view. Recommendation:       - Discharge patient to home (with escort).                       - Advance diet as tolerated.                       - Continue present medications.                        - Await pathology results.                       - Repeat colonoscopy in 3 years for surveillance, due                        to previous history of polyps                       - The findings and recommendations were discussed with                        the patient.                       - The findings and recommendations were discussed with                        the patient's family.                       - Return to primary care physician as previously                        scheduled.                       - High fiber diet.                       - Continue present medications. Procedure Code(s):    --- Professional ---                       [phone], Colonoscopy, flexible; with biopsy, single or                        multiple Diagnosis Code(s):    --- Professional ---                       Z86.010, Personal history of colonic polyps  K57.30, Diverticulosis of large intestine without                        perforation or abscess without bleeding                       D12.5, Benign neoplasm of sigmoid colon CPT copyright 2017 American Medical Association. All rights reserved. The codes documented in this report are preliminary and upon coder review may  be revised to meet current compliance requirements.  Vonda Antigua, MD Margretta Sidle B. Bonna Gains MD, MD 10/24/2017 2:05:44 PM This report has been signed electronically. Number of Addenda: 0 Note Initiated On: 10/24/2017 11:31 AM Scope Withdrawal Time: 0 hours 26 minutes 1 second  Total Procedure Duration: 0 hours 35 minutes 50 seconds  Estimated Blood Loss: Estimated blood loss: none.      [facility]

## 2017-10-25 ENCOUNTER — Encounter: Payer: Self-pay | Admitting: Gastroenterology

## 2017-10-26 LAB — SURGICAL PATHOLOGY

## 2017-11-30 ENCOUNTER — Encounter: Payer: Self-pay | Admitting: Gastroenterology

## 2017-11-30 ENCOUNTER — Ambulatory Visit: Payer: Medicare Other | Admitting: Gastroenterology

## 2018-02-22 ENCOUNTER — Other Ambulatory Visit: Payer: Self-pay | Admitting: Nurse Practitioner

## 2018-02-22 ENCOUNTER — Telehealth: Payer: Self-pay | Admitting: *Deleted

## 2018-02-22 DIAGNOSIS — Z1231 Encounter for screening mammogram for malignant neoplasm of breast: Secondary | ICD-10-CM

## 2018-02-22 NOTE — Telephone Encounter (Signed)
Received referral for low dose lung cancer screening CT scan.  Message left at phone number listed in EMR for patient to call either myself or Shawn Perkins back at [phone] to facilitate scheduling the scan.    

## 2018-03-01 ENCOUNTER — Telehealth: Payer: Self-pay | Admitting: *Deleted

## 2018-03-01 DIAGNOSIS — Z87891 Personal history of nicotine dependence: Secondary | ICD-10-CM

## 2018-03-01 DIAGNOSIS — Z122 Encounter for screening for malignant neoplasm of respiratory organs: Secondary | ICD-10-CM

## 2018-03-01 NOTE — Telephone Encounter (Signed)
Received referral for initial lung cancer screening scan. Contacted patient and obtained smoking history,(current, 95 pack year) as well as answering questions related to screening process. Patient denies signs of lung cancer such as weight loss or hemoptysis. Patient denies comorbidity that would prevent curative treatment if lung cancer were found. Patient is scheduled for shared decision making visit and CT scan on 03/09/18 at 215pm.

## 2018-03-08 ENCOUNTER — Telehealth: Payer: Self-pay | Admitting: *Deleted

## 2018-03-08 NOTE — Telephone Encounter (Signed)
Patient called to cancel lung screening scan. Will reschedule first of the year due to personal issues.

## 2018-03-09 ENCOUNTER — Ambulatory Visit: Payer: Medicare Other

## 2018-03-09 ENCOUNTER — Inpatient Hospital Stay: Payer: Medicare Other | Admitting: Nurse Practitioner

## 2018-04-13 ENCOUNTER — Telehealth: Payer: Self-pay | Admitting: *Deleted

## 2018-04-13 NOTE — Telephone Encounter (Signed)
Received referral for low dose lung cancer screening CT scan. Message left at phone number listed in EMR for patient to call me back to facilitate scheduling scan.  

## 2018-04-17 ENCOUNTER — Telehealth: Payer: Self-pay | Admitting: *Deleted

## 2018-04-17 ENCOUNTER — Encounter: Payer: Self-pay | Admitting: *Deleted

## 2018-04-17 NOTE — Telephone Encounter (Signed)
Patient is agreeable to lung screening scan to be rescheduled for 05/02/18 at 115pm

## 2018-05-02 ENCOUNTER — Inpatient Hospital Stay: Payer: PPO | Attending: Nurse Practitioner | Admitting: Oncology

## 2018-05-02 ENCOUNTER — Encounter: Payer: Self-pay | Admitting: Oncology

## 2018-05-02 ENCOUNTER — Ambulatory Visit
Admission: RE | Admit: 2018-05-02 | Discharge: 2018-05-02 | Disposition: A | Discharge status: Home / Self Care | Payer: PPO | Source: Ambulatory Visit | Attending: Nurse Practitioner | Admitting: Nurse Practitioner

## 2018-05-02 DIAGNOSIS — Z87891 Personal history of nicotine dependence: Secondary | ICD-10-CM | POA: Insufficient documentation

## 2018-05-02 DIAGNOSIS — F17210 Nicotine dependence, cigarettes, uncomplicated: Secondary | ICD-10-CM | POA: Diagnosis not present

## 2018-05-02 DIAGNOSIS — Z122 Encounter for screening for malignant neoplasm of respiratory organs: Secondary | ICD-10-CM | POA: Diagnosis not present

## 2018-05-03 ENCOUNTER — Encounter: Payer: Self-pay | Admitting: *Deleted

## 2018-09-27 ENCOUNTER — Other Ambulatory Visit: Payer: Self-pay | Admitting: Family Medicine

## 2018-09-27 DIAGNOSIS — Z1231 Encounter for screening mammogram for malignant neoplasm of breast: Secondary | ICD-10-CM

## 2019-01-31 ENCOUNTER — Other Ambulatory Visit
Admission: RE | Admit: 2019-01-31 | Discharge: 2019-01-31 | Disposition: A | Discharge status: Home / Self Care | Payer: PPO | Attending: Family Medicine | Admitting: Family Medicine

## 2019-01-31 NOTE — ED Notes (Addendum)
Pt here in custody of [employer] Officer Doree Fudge for forensic blood draw. Pt gives verbal consent for this RN to draw blood for forensic draw. Verifies name and DOB. Name and DOB verified with patient stickers as well as arm band. Pt ambulatory. Pt in NAD, RR even and unlabored. Color WNL. Alert and oriented. Voices no need to see ED Provider.   Blood draw using kit provided by [employer] officer Doree Fudge, [place] voices good understanding of blood draw to be performed for forensic testing; pt verifies identity with name and DOB. Using sealed kit provided by officer, tourniquet applied to right upper arm; right antecubital region prepped with non-alcohol prep pad provided in kit, allowed to dry completely; needle inserted and 2 grey top blood tubes collected; tourniquet removed, needle removed & intact, dressing applied; tubes labeled, given to officer Doree Fudge. Placed in sealed kit and in custody of officer [place].   Pt ambulatory upon leaving ER.

## 2019-05-02 ENCOUNTER — Telehealth: Payer: Self-pay

## 2019-05-02 NOTE — Telephone Encounter (Signed)
Left message for patient to notify them that it is time to schedule annual low dose lung cancer screening CT scan. Instructed patient to call back ([phone]) to verify information prior to the scan being scheduled.  

## 2019-05-24 ENCOUNTER — Telehealth: Payer: Self-pay | Admitting: *Deleted

## 2019-05-24 DIAGNOSIS — Z87891 Personal history of nicotine dependence: Secondary | ICD-10-CM

## 2019-05-24 NOTE — Telephone Encounter (Signed)
Patient has been notified that annual lung cancer screening low dose CT scan is due currently or will be in near future. Confirmed that patient is within the age range of 55-77, and asymptomatic, (no signs or symptoms of lung cancer). Patient denies illness that would prevent curative treatment for lung cancer if found. Verified smoking history, (current, 95.25 pack year). The shared decision making visit was done 05/02/18. Patient is agreeable for CT scan being scheduled.

## 2019-05-29 ENCOUNTER — Ambulatory Visit
Admission: RE | Admit: 2019-05-29 | Discharge: 2019-05-29 | Disposition: A | Discharge status: Home / Self Care | Payer: Medicare Other | Source: Ambulatory Visit | Attending: Nurse Practitioner | Admitting: Nurse Practitioner

## 2019-05-29 ENCOUNTER — Other Ambulatory Visit: Payer: Self-pay

## 2019-05-29 DIAGNOSIS — Z87891 Personal history of nicotine dependence: Secondary | ICD-10-CM | POA: Diagnosis present

## 2019-05-31 ENCOUNTER — Encounter: Payer: Self-pay | Admitting: *Deleted

## 2019-12-14 ENCOUNTER — Encounter: Payer: Self-pay | Admitting: Emergency Medicine

## 2019-12-14 ENCOUNTER — Emergency Department: Payer: Medicare Other

## 2019-12-14 ENCOUNTER — Inpatient Hospital Stay
Admission: EM | Admit: 2019-12-14 | Discharge: 2019-12-18 | DRG: 682 | Disposition: A | Discharge status: Home / Self Care | Payer: Medicare Other | Attending: Internal Medicine | Admitting: Internal Medicine

## 2019-12-14 ENCOUNTER — Other Ambulatory Visit: Payer: Self-pay

## 2019-12-14 DIAGNOSIS — R739 Hyperglycemia, unspecified: Secondary | ICD-10-CM | POA: Diagnosis present

## 2019-12-14 DIAGNOSIS — R531 Weakness: Secondary | ICD-10-CM

## 2019-12-14 DIAGNOSIS — G9341 Metabolic encephalopathy: Secondary | ICD-10-CM | POA: Diagnosis present

## 2019-12-14 DIAGNOSIS — F17210 Nicotine dependence, cigarettes, uncomplicated: Secondary | ICD-10-CM | POA: Diagnosis present

## 2019-12-14 DIAGNOSIS — Z9221 Personal history of antineoplastic chemotherapy: Secondary | ICD-10-CM | POA: Diagnosis not present

## 2019-12-14 DIAGNOSIS — F1120 Opioid dependence, uncomplicated: Secondary | ICD-10-CM | POA: Diagnosis present

## 2019-12-14 DIAGNOSIS — Y929 Unspecified place or not applicable: Secondary | ICD-10-CM

## 2019-12-14 DIAGNOSIS — T426X5A Adverse effect of other antiepileptic and sedative-hypnotic drugs, initial encounter: Secondary | ICD-10-CM | POA: Diagnosis present

## 2019-12-14 DIAGNOSIS — N1831 Chronic kidney disease, stage 3a: Secondary | ICD-10-CM | POA: Diagnosis present

## 2019-12-14 DIAGNOSIS — R634 Abnormal weight loss: Secondary | ICD-10-CM | POA: Diagnosis present

## 2019-12-14 DIAGNOSIS — I493 Ventricular premature depolarization: Secondary | ICD-10-CM | POA: Diagnosis present

## 2019-12-14 DIAGNOSIS — F1911 Other psychoactive substance abuse, in remission: Secondary | ICD-10-CM | POA: Diagnosis present

## 2019-12-14 DIAGNOSIS — E872 Acidosis: Secondary | ICD-10-CM | POA: Diagnosis present

## 2019-12-14 DIAGNOSIS — Z20822 Contact with and (suspected) exposure to covid-19: Secondary | ICD-10-CM | POA: Diagnosis present

## 2019-12-14 DIAGNOSIS — F5104 Psychophysiologic insomnia: Secondary | ICD-10-CM | POA: Diagnosis present

## 2019-12-14 DIAGNOSIS — R270 Ataxia, unspecified: Secondary | ICD-10-CM | POA: Diagnosis present

## 2019-12-14 DIAGNOSIS — E860 Dehydration: Secondary | ICD-10-CM | POA: Diagnosis present

## 2019-12-14 DIAGNOSIS — D631 Anemia in chronic kidney disease: Secondary | ICD-10-CM | POA: Diagnosis present

## 2019-12-14 DIAGNOSIS — I10 Essential (primary) hypertension: Secondary | ICD-10-CM | POA: Diagnosis present

## 2019-12-14 DIAGNOSIS — Z79899 Other long term (current) drug therapy: Secondary | ICD-10-CM | POA: Diagnosis not present

## 2019-12-14 DIAGNOSIS — R296 Repeated falls: Secondary | ICD-10-CM | POA: Diagnosis present

## 2019-12-14 DIAGNOSIS — N189 Chronic kidney disease, unspecified: Secondary | ICD-10-CM | POA: Diagnosis not present

## 2019-12-14 DIAGNOSIS — R197 Diarrhea, unspecified: Secondary | ICD-10-CM | POA: Diagnosis present

## 2019-12-14 DIAGNOSIS — I129 Hypertensive chronic kidney disease with stage 1 through stage 4 chronic kidney disease, or unspecified chronic kidney disease: Secondary | ICD-10-CM | POA: Diagnosis present

## 2019-12-14 DIAGNOSIS — I959 Hypotension, unspecified: Secondary | ICD-10-CM | POA: Diagnosis present

## 2019-12-14 DIAGNOSIS — E876 Hypokalemia: Secondary | ICD-10-CM | POA: Diagnosis present

## 2019-12-14 DIAGNOSIS — F319 Bipolar disorder, unspecified: Secondary | ICD-10-CM | POA: Diagnosis present

## 2019-12-14 DIAGNOSIS — N183 Chronic kidney disease, stage 3 unspecified: Secondary | ICD-10-CM | POA: Diagnosis present

## 2019-12-14 DIAGNOSIS — N179 Acute kidney failure, unspecified: Principal | ICD-10-CM | POA: Diagnosis present

## 2019-12-14 LAB — CBC WITH DIFFERENTIAL/PLATELET
Abs Immature Granulocytes: 0.02 10*3/uL (ref 0.00–0.07)
Basophils Absolute: 0 10*3/uL (ref 0.0–0.1)
Basophils Relative: 0 %
Eosinophils Absolute: 0.1 10*3/uL (ref 0.0–0.5)
Eosinophils Relative: 2 %
HCT: 31 % — ABNORMAL LOW (ref 36.0–46.0)
Hemoglobin: 9.9 g/dL — ABNORMAL LOW (ref 12.0–15.0)
Immature Granulocytes: 0 %
Lymphocytes Relative: 29 %
Lymphs Abs: 2.5 10*3/uL (ref 0.7–4.0)
MCH: 30.1 pg (ref 26.0–34.0)
MCHC: 31.9 g/dL (ref 30.0–36.0)
MCV: 94.2 fL (ref 80.0–100.0)
Monocytes Absolute: 0.7 10*3/uL (ref 0.1–1.0)
Monocytes Relative: 8 %
Neutro Abs: 5.2 10*3/uL (ref 1.7–7.7)
Neutrophils Relative %: 61 %
Platelets: 293 10*3/uL (ref 150–400)
RBC: 3.29 MIL/uL — ABNORMAL LOW (ref 3.87–5.11)
RDW: 13.2 % (ref 11.5–15.5)
WBC: 8.6 10*3/uL (ref 4.0–10.5)
nRBC: 0 % (ref 0.0–0.2)

## 2019-12-14 LAB — SARS CORONAVIRUS 2 BY RT PCR (HOSPITAL ORDER, PERFORMED IN ~~LOC~~ HOSPITAL LAB): SARS Coronavirus 2: NEGATIVE

## 2019-12-14 LAB — COMPREHENSIVE METABOLIC PANEL
ALT: 14 U/L (ref 0–44)
AST: 17 U/L (ref 15–41)
Albumin: 3.8 g/dL (ref 3.5–5.0)
Alkaline Phosphatase: 38 U/L (ref 38–126)
Anion gap: 13 (ref 5–15)
BUN: 73 mg/dL — ABNORMAL HIGH (ref 8–23)
CO2: 19 mmol/L — ABNORMAL LOW (ref 22–32)
Calcium: 8.8 mg/dL — ABNORMAL LOW (ref 8.9–10.3)
Chloride: 104 mmol/L (ref 98–111)
Creatinine, Ser: 6.63 mg/dL — ABNORMAL HIGH (ref 0.44–1.00)
GFR calc Af Amer: 7 mL/min — ABNORMAL LOW (ref >60)
GFR calc non Af Amer: 6 mL/min — ABNORMAL LOW (ref >60)
Glucose, Bld: 99 mg/dL (ref 70–99)
Potassium: 4.7 mmol/L (ref 3.5–5.1)
Sodium: 136 mmol/L (ref 135–145)
Total Bilirubin: 0.7 mg/dL (ref 0.3–1.2)
Total Protein: 7.9 g/dL (ref 6.5–8.1)

## 2019-12-14 LAB — MAGNESIUM: Magnesium: 1.9 mg/dL (ref 1.7–2.4)

## 2019-12-14 LAB — LACTIC ACID, PLASMA: Lactic Acid, Venous: 0.6 mmol/L (ref 0.5–1.9)

## 2019-12-14 MED ORDER — SODIUM CHLORIDE 0.9 % IV BOLUS
500.0000 mL | Freq: Once | INTRAVENOUS | Status: AC
  Administered 2019-12-14: 500 mL via INTRAVENOUS

## 2019-12-14 MED ORDER — SODIUM CHLORIDE 0.9 % IV BOLUS: 1000.0000 mL | Freq: Once | INTRAVENOUS | Status: DC

## 2019-12-14 MED ORDER — LACTATED RINGERS IV SOLN: INTRAVENOUS | Status: DC

## 2019-12-14 MED ORDER — SODIUM CHLORIDE 0.9 % IV SOLN: Freq: Once | INTRAVENOUS | Status: DC

## 2019-12-14 NOTE — ED Provider Notes (Signed)
[facility] Emergency Department Provider Note  ____________________________________________   First MD Initiated Contact with Patient 12/14/19 2021     (approximate)  I have reviewed the triage vital signs and the nursing notes.   HISTORY  Chief Complaint Fall    HPI Andrea Hobbs is a 64 y.o. female  With h/o HTN, depression, polysubstance dependence here with weakness, falls. Pt reports that over the last month, she has had increasing weakness, falls, and confusion. She feels like it is related to her PCP switching her from [place] to gabapentin, and she feels like this is making it difficult to sleep and causing "jerks" of her arms. She has also had poor appetite, weight loss, and weakness. She has fallen multiple times due to her legs "giving out." Reports that her sx have been gradually worsening. No acute change. No fever, chills, or recent infectious sx. No n/v/d. She feels lightheaded when standing, worse w/ exertion.   Per mother's report, pt is on multiple medications including chronic opiates and benzos, and mother expresses concern pt is taking more than the prescribed amount.        Past Medical History:  Diagnosis Date  . Depression   . Hypertension     Patient Active Problem List   Diagnosis Date Noted  . Acute kidney injury superimposed on CKD ([place]) 12/14/2019  . CKD (chronic kidney disease) stage 3, GFR 30-59 ml/min 12/14/2019  . Bipolar disorder ([place]) 12/14/2019  . HTN (hypertension) 12/14/2019  . History of intravenous drug abuse ([place]) 12/14/2019  . AKI (acute kidney injury) ([place]) 12/14/2019  . History of colonic polyps   . Special screening for malignant neoplasms, colon   . Polyp of sigmoid colon   . Diverticulosis of large intestine without diverticulitis   . Benign neoplasm of ascending colon   . Benign neoplasm of transverse colon   . Intestinal lump   . Hypoxemia requiring supplemental oxygen 10/15/2015  . Obesity  10/15/2015  . Tricuspid regurgitation 10/15/2015    Past Surgical History:  Procedure Laterality Date  . BREAST SURGERY Right    benign breast lump removed  . COLONOSCOPY WITH PROPOFOL N/A 06/15/2017   Procedure: COLONOSCOPY WITH PROPOFOL;  Surgeon: Virgel Manifold, MD;  Location: [facility] ENDOSCOPY;  Service: Endoscopy;  Laterality: N/A;  . COLONOSCOPY WITH PROPOFOL N/A 10/24/2017   Procedure: COLONOSCOPY WITH PROPOFOL;  Surgeon: Virgel Manifold, MD;  Location: [facility] ENDOSCOPY;  Service: Endoscopy;  Laterality: N/A;  . DILATION AND CURETTAGE OF UTERUS    . TONSILLECTOMY      Prior to Admission medications   Medication Sig Start Date End Date Taking? Authorizing Provider  albuterol (PROVENTIL HFA;VENTOLIN HFA) 108 (90 Base) MCG/ACT inhaler Inhale 2 puffs into the lungs every 4 (four) hours as needed for wheezing or shortness of breath.   Yes [provider]  Buprenorphine HCl-Naloxone HCl (SUBOXONE) 8-2 MG FILM Take 1 Film by mouth daily. 03/21/15  Yes [provider]  FLUoxetine (PROZAC) 20 MG capsule Take 60 mg by mouth daily. 01/03/15  Yes [provider]  furosemide (LASIX) 20 MG tablet Take 20 mg by mouth.   Yes [provider]  gabapentin (NEURONTIN) 600 MG tablet Take 300-600 mg by mouth at bedtime. 12/06/19  Yes [provider]  lisinopril (PRINIVIL,ZESTRIL) 5 MG tablet Take 5 mg by mouth daily.   Yes [provider]  potassium chloride SA (K-DUR,KLOR-CON) 20 MEQ tablet Take 20 mEq by mouth daily.  06/27/17  Yes [provider]  QUEtiapine (SEROQUEL) 100 MG tablet Take 50-100 mg by mouth at bedtime as needed. 12/10/19  Yes [provider]  traZODone (DESYREL) 100 MG tablet Take 100 mg by mouth at bedtime.  12/08/10  Yes [provider]    Allergies Patient has no known allergies.  History reviewed. No pertinent family history.  Social History Social History   Tobacco Use  . Smoking status:  Current Every Day Smoker    Packs/day: 2.71    Years: 35.00    Pack years: 94.85    Types: Cigarettes  . Smokeless tobacco: Never Used  . Tobacco comment: .5 pack per day currently  Vaping Use  . Vaping Use: Every day  Substance Use Topics  . Alcohol use: No  . Drug use: No    Review of Systems  Review of Systems  Constitutional: Positive for fatigue. Negative for fever.  HENT: Negative for congestion and sore throat.   Eyes: Negative for visual disturbance.  Respiratory: Negative for cough and shortness of breath.   Cardiovascular: Negative for chest pain.  Gastrointestinal: Negative for abdominal pain, diarrhea, nausea and vomiting.  Genitourinary: Negative for flank pain.  Musculoskeletal: Negative for back pain and neck pain.  Skin: Negative for rash and wound.  Neurological: Positive for weakness and light-headedness.  All other systems reviewed and are negative.    ____________________________________________  PHYSICAL EXAM:      VITAL SIGNS: ED Triage Vitals  Enc Vitals Group     BP 12/14/19 1755 (!) 80/47     Pulse Rate 12/14/19 1755 76     Resp 12/14/19 1755 20     Temp 12/14/19 1753 98.1 F (36.7 C)     Temp Source 12/14/19 1753 Oral     SpO2 12/14/19 1755 100 %     Weight 12/14/19 1753 120 lb (54.4 kg)     Height 12/14/19 1753 5\' 3"  (1.6 m)     Head Circumference --      Peak Flow --      Pain Score 12/14/19 1753 8     Pain Loc --      Pain Edu? --      Excl. in [place]? --      Physical Exam Vitals and nursing note reviewed.  Constitutional:      General: She is not in acute distress.    Appearance: She is well-developed.  HENT:     Head: Normocephalic and atraumatic.     Mouth/Throat:     Mouth: Mucous membranes are dry.  Eyes:     Conjunctiva/sclera: Conjunctivae normal.  Cardiovascular:     Rate and Rhythm: Normal rate and regular rhythm.     Heart sounds: Normal heart sounds. No murmur heard.  No friction rub.  Pulmonary:     Effort:  Pulmonary effort is normal. No respiratory distress.     Breath sounds: Normal breath sounds. No wheezing or rales.  Abdominal:     General: There is no distension.     Palpations: Abdomen is soft.     Tenderness: There is no abdominal tenderness.  Musculoskeletal:     Cervical back: Neck supple.  Skin:    General: Skin is warm.     Capillary Refill: Capillary refill takes less than 2 seconds.  Neurological:     Mental Status: She is alert and oriented to person, place, and time.     Motor: No abnormal muscle tone.       ____________________________________________  LABS (all labs ordered are listed, but only abnormal results are displayed)  Labs Reviewed  CBC WITH DIFFERENTIAL/PLATELET - Abnormal; Notable for the following components:      Result Value   RBC 3.29 (*)    Hemoglobin 9.9 (*)    HCT 31.0 (*)    All other components within normal limits  COMPREHENSIVE METABOLIC PANEL - Abnormal; Notable for the following components:   CO2 19 (*)    BUN 73 (*)    Creatinine, Ser 6.63 (*)    Calcium 8.8 (*)    GFR calc non Af Amer 6 (*)    GFR calc Af Amer 7 (*)    All other components within normal limits  URINALYSIS, COMPLETE (UACMP) WITH MICROSCOPIC - Abnormal; Notable for the following components:   Color, Urine YELLOW (*)    APPearance CLOUDY (*)    Hgb urine dipstick SMALL (*)    Leukocytes,Ua MODERATE (*)    Bacteria, UA RARE (*)    All other components within normal limits  SARS CORONAVIRUS 2 BY RT PCR (HOSPITAL ORDER, [place] [facility])  LACTIC ACID, PLASMA  MAGNESIUM  URINE DRUG SCREEN, QUALITATIVE ([facility] ONLY)    ____________________________________________  EKG: Normal sinus rhythm, VR 70. PR 164, QRS 86, QTc 434. No acute ST elevations or depressions. No ischemia or infarct. ________________________________________  RADIOLOGY All imaging, including plain films, CT scans, and ultrasounds, independently reviewed by me, and  interpretations confirmed via formal radiology reads.  ED MD interpretation:   CT Head: [place]  Official radiology report(s): CT Head Wo Contrast  Result Date: 12/14/2019 CLINICAL DATA:  Increased falls. EXAM: CT HEAD WITHOUT CONTRAST TECHNIQUE: Contiguous axial images were obtained from the base of the skull through the vertex without intravenous contrast. COMPARISON:  None. FINDINGS: Brain: There is mild cerebral atrophy with widening of the extra-axial spaces and ventricular dilatation. There are areas of decreased attenuation within the white matter tracts of the supratentorial brain, consistent with microvascular disease changes. Vascular: No hyperdense vessel or unexpected calcification. Skull: Normal. Negative for fracture or focal lesion. Sinuses/Orbits: No acute finding. Other: Mild scalp soft tissue swelling is seen along the posterior aspect of the vertex on the right. IMPRESSION: 1. Generalized cerebral atrophy. 2. No acute intracranial abnormality. Electronically Signed   By: Virgina Norfolk M.D.   On: 12/14/2019 18:51    ____________________________________________  PROCEDURES   Procedure(s) performed (including Critical Care):  Procedures  ____________________________________________  INITIAL IMPRESSION / MDM / [place] / ED COURSE  As part of my medical decision making, I reviewed the following data within the [place] notes reviewed and incorporated, Old chart reviewed, Notes from prior ED visits, and [place] Controlled Substance Database       *Andrea Hobbs was evaluated in Emergency Department on 12/15/2019 for the symptoms described in the history of present illness. She was evaluated in the context of the global COVID-19 pandemic, which necessitated consideration that the patient might be at risk for infection with the SARS-CoV-2 virus that causes COVID-19. Institutional protocols and algorithms that pertain to the evaluation of  patients at risk for COVID-19 are in a state of rapid change based on information released by regulatory bodies including the CDC and federal and state organizations. These policies and algorithms were followed during the patient's care in the ED.  Some ED evaluations and interventions may be delayed as a result of limited staffing during the pandemic.*     Medical  Decision Making:  64 yo F here with generalized weakness, falls. On exam, pt appears dehydrated but non-toxic, in no distress. She is mildly hypotensive here but is adamant she has had no fever, chills, cough, dysuria, or infectious symptoms. WBC normal. LA normal without signs of hypoperfusion. Labs are most notable for what I suspect is acute on chronic kidney injury, which is likely contributing to her weakness as well as falls, given that she is on multiple medications that are renally cleared and could contribute to falls in high doses. Will admit for IVF, management of AKI, and work-up.  ____________________________________________  FINAL CLINICAL IMPRESSION(S) / ED DIAGNOSES  Final diagnoses:  AKI (acute kidney injury) ([place])  Dehydration  Generalized weakness     MEDICATIONS GIVEN DURING THIS VISIT:  Medications  0.9 %  sodium chloride infusion (has no administration in time range)  sodium chloride 0.9 % bolus 500 mL (0 mLs Intravenous Stopped 12/14/19 1859)  sodium chloride 0.9 % bolus 500 mL ( Intravenous Stopped 12/14/19 2319)     ED Discharge Orders    None       Note:  This document was prepared using Dragon voice recognition software and may include unintentional dictation errors.   Duffy Bruce, MD 12/15/19 [phone]

## 2019-12-14 NOTE — ED Notes (Signed)
After several visits prompting pt for urine and attempts to pee --  EDP asked for I&O order

## 2019-12-14 NOTE — ED Triage Notes (Signed)
Pt presents to ED via POV with c/o fall. Pt states increasing number of falls x 2 weeks. Pt states she is having more and more scabs from falls . Pt with noted bruising and scabs to face, arms, legs, nose. Pt states she keeps leaning backwards and falling. Pt presents alert and oriented at this time.   Pt states increasing difficulty with memory. At this time, states feels like she is unable to finish train of thought with increasing difficulty.

## 2019-12-15 ENCOUNTER — Inpatient Hospital Stay: Payer: Medicare Other

## 2019-12-15 DIAGNOSIS — F17200 Nicotine dependence, unspecified, uncomplicated: Secondary | ICD-10-CM

## 2019-12-15 DIAGNOSIS — F319 Bipolar disorder, unspecified: Secondary | ICD-10-CM

## 2019-12-15 DIAGNOSIS — I10 Essential (primary) hypertension: Secondary | ICD-10-CM

## 2019-12-15 DIAGNOSIS — D649 Anemia, unspecified: Secondary | ICD-10-CM

## 2019-12-15 DIAGNOSIS — G9341 Metabolic encephalopathy: Secondary | ICD-10-CM

## 2019-12-15 DIAGNOSIS — E162 Hypoglycemia, unspecified: Secondary | ICD-10-CM

## 2019-12-15 DIAGNOSIS — R634 Abnormal weight loss: Secondary | ICD-10-CM

## 2019-12-15 DIAGNOSIS — F1129 Opioid dependence with unspecified opioid-induced disorder: Secondary | ICD-10-CM

## 2019-12-15 DIAGNOSIS — N189 Chronic kidney disease, unspecified: Secondary | ICD-10-CM

## 2019-12-15 DIAGNOSIS — N179 Acute kidney failure, unspecified: Principal | ICD-10-CM

## 2019-12-15 DIAGNOSIS — I959 Hypotension, unspecified: Secondary | ICD-10-CM

## 2019-12-15 DIAGNOSIS — R270 Ataxia, unspecified: Secondary | ICD-10-CM

## 2019-12-15 LAB — GLUCOSE, CAPILLARY
Glucose-Capillary: 57 mg/dL — ABNORMAL LOW (ref 70–99)
Glucose-Capillary: 76 mg/dL (ref 70–99)

## 2019-12-15 LAB — CBC
HCT: 32.8 % — ABNORMAL LOW (ref 36.0–46.0)
HCT: 30.5 % — ABNORMAL LOW (ref 36.0–46.0)
Hemoglobin: 10.5 g/dL — ABNORMAL LOW (ref 12.0–15.0)
Hemoglobin: 9.8 g/dL — ABNORMAL LOW (ref 12.0–15.0)
MCH: 30.3 pg (ref 26.0–34.0)
MCH: 30.5 pg (ref 26.0–34.0)
MCHC: 32 g/dL (ref 30.0–36.0)
MCHC: 32.1 g/dL (ref 30.0–36.0)
MCV: 94.8 fL (ref 80.0–100.0)
MCV: 95 fL (ref 80.0–100.0)
Platelets: 277 10*3/uL (ref 150–400)
Platelets: 244 10*3/uL (ref 150–400)
RBC: 3.46 MIL/uL — ABNORMAL LOW (ref 3.87–5.11)
RBC: 3.21 MIL/uL — ABNORMAL LOW (ref 3.87–5.11)
RDW: 13.2 % (ref 11.5–15.5)
RDW: 13.1 % (ref 11.5–15.5)
WBC: 8 10*3/uL (ref 4.0–10.5)
WBC: 8 10*3/uL (ref 4.0–10.5)
nRBC: 0 % (ref 0.0–0.2)
nRBC: 0 % (ref 0.0–0.2)

## 2019-12-15 LAB — HEMOGLOBIN A1C
Hgb A1c MFr Bld: 6.3 % — ABNORMAL HIGH (ref 4.8–5.6)
Mean Plasma Glucose: 134.11 mg/dL

## 2019-12-15 LAB — URINALYSIS, COMPLETE (UACMP) WITH MICROSCOPIC
Bilirubin Urine: NEGATIVE
Glucose, UA: NEGATIVE mg/dL
Ketones, ur: NEGATIVE mg/dL
Nitrite: NEGATIVE
Protein, ur: NEGATIVE mg/dL
Specific Gravity, Urine: 1.005 (ref 1.005–1.030)
pH: 6 (ref 5.0–8.0)

## 2019-12-15 LAB — BASIC METABOLIC PANEL
Anion gap: 9 (ref 5–15)
BUN: 65 mg/dL — ABNORMAL HIGH (ref 8–23)
CO2: 19 mmol/L — ABNORMAL LOW (ref 22–32)
Calcium: 7.8 mg/dL — ABNORMAL LOW (ref 8.9–10.3)
Chloride: 112 mmol/L — ABNORMAL HIGH (ref 98–111)
Creatinine, Ser: 5.7 mg/dL — ABNORMAL HIGH (ref 0.44–1.00)
GFR calc Af Amer: 9 mL/min — ABNORMAL LOW (ref >60)
GFR calc non Af Amer: 7 mL/min — ABNORMAL LOW (ref >60)
Glucose, Bld: 67 mg/dL — ABNORMAL LOW (ref 70–99)
Potassium: 4.5 mmol/L (ref 3.5–5.1)
Sodium: 140 mmol/L (ref 135–145)

## 2019-12-15 LAB — VITAMIN B12: Vitamin B-12: 276 pg/mL (ref 180–914)

## 2019-12-15 LAB — URINE DRUG SCREEN, QUALITATIVE (ARMC ONLY)
Amphetamines, Ur Screen: NOT DETECTED
Barbiturates, Ur Screen: NOT DETECTED
Benzodiazepine, Ur Scrn: NOT DETECTED
Cannabinoid 50 Ng, Ur ~~LOC~~: NOT DETECTED
Cocaine Metabolite,Ur ~~LOC~~: NOT DETECTED
MDMA (Ecstasy)Ur Screen: NOT DETECTED
Methadone Scn, Ur: NOT DETECTED
Opiate, Ur Screen: NOT DETECTED
Phencyclidine (PCP) Ur S: NOT DETECTED
Tricyclic, Ur Screen: NOT DETECTED

## 2019-12-15 LAB — IRON AND TIBC
Iron: 59 ug/dL (ref 28–170)
Saturation Ratios: 21 % (ref 10.4–31.8)
TIBC: 288 ug/dL (ref 250–450)
UIBC: 229 ug/dL

## 2019-12-15 LAB — CREATININE, SERUM
Creatinine, Ser: 6.17 mg/dL — ABNORMAL HIGH (ref 0.44–1.00)
GFR calc Af Amer: 8 mL/min — ABNORMAL LOW (ref >60)
GFR calc non Af Amer: 7 mL/min — ABNORMAL LOW (ref >60)

## 2019-12-15 LAB — FOLATE: Folate: 17.7 ng/mL (ref >5.9)

## 2019-12-15 LAB — TSH: TSH: 1.812 u[IU]/mL (ref 0.350–4.500)

## 2019-12-15 LAB — FERRITIN: Ferritin: 148 ng/mL (ref 11–307)

## 2019-12-15 LAB — HIV ANTIBODY (ROUTINE TESTING W REFLEX): HIV Screen 4th Generation wRfx: NONREACTIVE

## 2019-12-15 MED ORDER — SODIUM CHLORIDE 0.9 % IV SOLN: INTRAVENOUS | Status: DC

## 2019-12-15 MED ORDER — THIAMINE HCL 100 MG/ML IJ SOLN
500.0000 mg | Freq: Three times a day (TID) | INTRAVENOUS | Status: DC
  Administered 2019-12-15 – 2019-12-16 (×5): 500 mg via INTRAVENOUS
  Filled 2019-12-15 (×6): qty 5

## 2019-12-15 MED ORDER — HEPARIN SODIUM (PORCINE) 5000 UNIT/ML IJ SOLN
5000.0000 [IU] | Freq: Three times a day (TID) | INTRAMUSCULAR | Status: DC
  Administered 2019-12-15 – 2019-12-18 (×9): 5000 [IU] via SUBCUTANEOUS
  Filled 2019-12-15 (×9): qty 1

## 2019-12-15 MED ORDER — FLUOXETINE HCL 20 MG PO CAPS
60.0000 mg | ORAL_CAPSULE | Freq: Every day | ORAL | Status: DC
  Administered 2019-12-15 – 2019-12-18 (×4): 60 mg via ORAL
  Filled 2019-12-15 (×4): qty 3

## 2019-12-15 MED ORDER — BUPRENORPHINE HCL 8 MG SL SUBL
8.0000 mg | SUBLINGUAL_TABLET | Freq: Two times a day (BID) | SUBLINGUAL | Status: DC
  Administered 2019-12-15 – 2019-12-18 (×7): 8 mg via SUBLINGUAL
  Filled 2019-12-15 (×9): qty 1

## 2019-12-15 MED ORDER — VITAMIN B-12 1000 MCG PO TABS
1000.0000 ug | ORAL_TABLET | Freq: Every day | ORAL | Status: DC
  Administered 2019-12-16 – 2019-12-18 (×3): 1000 ug via ORAL
  Filled 2019-12-15 (×4): qty 1

## 2019-12-15 MED ORDER — NICOTINE 14 MG/24HR TD PT24
14.0000 mg | MEDICATED_PATCH | Freq: Every day | TRANSDERMAL | Status: DC
  Administered 2019-12-15 – 2019-12-16 (×2): 14 mg via TRANSDERMAL
  Filled 2019-12-15 (×2): qty 1

## 2019-12-15 MED ORDER — COSYNTROPIN 0.25 MG IJ SOLR
0.2500 mg | Freq: Once | INTRAMUSCULAR | Status: AC
  Administered 2019-12-16: 0.25 mg via INTRAVENOUS
  Filled 2019-12-15 (×2): qty 0.25

## 2019-12-15 MED ORDER — SODIUM CHLORIDE 0.9 % IV BOLUS
1000.0000 mL | Freq: Once | INTRAVENOUS | Status: AC
  Administered 2019-12-15: 1000 mL via INTRAVENOUS

## 2019-12-15 MED ORDER — TRAZODONE HCL 100 MG PO TABS
100.0000 mg | ORAL_TABLET | Freq: Every day | ORAL | Status: DC
  Administered 2019-12-15 – 2019-12-17 (×3): 100 mg via ORAL
  Filled 2019-12-15 (×3): qty 1

## 2019-12-15 NOTE — ED Notes (Signed)
Glucose 67 on morning labs, patient given juice. Repeat CBG 57. PT given sandwich and drink, repeat CBG 73. Pt says she has not eaten in 3 days.

## 2019-12-15 NOTE — H&P (Signed)
History and Physical    Andrea Hobbs MWU:[id number] DOB: 1955-09-30 DOA: 12/14/2019  PCP: Elisabeth Cara, NP  Patient coming from: home   Chief Complaint: frequent falls, confusion  HPI: Andrea Hobbs is a 64 y.o. female with medical history significant for bipolar disorder, hx opioid dependence and IV heroin use now on suboxone, hcv s/p cure, htn, ckd 3, who presents with the above.  History from patient and her mother, with whom the patient lives. At least 2 months worsening confusion, intermittent trouble ambulating, frequent falls. Sleeping more than normal. Reports MJ use, otherwise no drugs. Sees therapist who prescribes suboxone. Thinks recently started on gabapentin. Mother says "she's the kind of person who will take anything you give her" and is concerned about possible misuse of her meds. Also very little PO, with recent weight loss. 120 today, from 134 in June of this year and 175 in 2019. Patient not complaining of pain, no fevers, no abdominal pain, occasional cough she attributes to smoking. No chest pain.   ED Course: 1 L fluids, labs, imaging  Review of Systems: As per HPI otherwise 10 point review of systems negative.    Past Medical History:  Diagnosis Date  . Depression   . Hypertension     Past Surgical History:  Procedure Laterality Date  . BREAST SURGERY Right    benign breast lump removed  . COLONOSCOPY WITH PROPOFOL N/A 06/15/2017   Procedure: COLONOSCOPY WITH PROPOFOL;  Surgeon: Virgel Manifold, MD;  Location: [facility] ENDOSCOPY;  Service: Endoscopy;  Laterality: N/A;  . COLONOSCOPY WITH PROPOFOL N/A 10/24/2017   Procedure: COLONOSCOPY WITH PROPOFOL;  Surgeon: Virgel Manifold, MD;  Location: [facility] ENDOSCOPY;  Service: Endoscopy;  Laterality: N/A;  . DILATION AND CURETTAGE OF UTERUS    . TONSILLECTOMY       reports that she has been smoking cigarettes. She has a 94.85 pack-year smoking history. She has never used smokeless tobacco. She reports that  she does not drink alcohol and does not use drugs.  No Known Allergies  History reviewed. No pertinent family history.  Prior to Admission medications   Medication Sig Start Date End Date Taking? Authorizing Provider  albuterol (PROVENTIL HFA;VENTOLIN HFA) 108 (90 Base) MCG/ACT inhaler Inhale 2 puffs into the lungs every 4 (four) hours as needed for wheezing or shortness of breath.   Yes [provider]  Buprenorphine HCl-Naloxone HCl (SUBOXONE) 8-2 MG FILM Take 1 Film by mouth daily. 03/21/15  Yes [provider]  FLUoxetine (PROZAC) 20 MG capsule Take 60 mg by mouth daily. 01/03/15  Yes [provider]  furosemide (LASIX) 20 MG tablet Take 20 mg by mouth.   Yes [provider]  gabapentin (NEURONTIN) 600 MG tablet Take 300-600 mg by mouth at bedtime. 12/06/19  Yes [provider]  lisinopril (PRINIVIL,ZESTRIL) 5 MG tablet Take 5 mg by mouth daily.   Yes [provider]  potassium chloride SA (K-DUR,KLOR-CON) 20 MEQ tablet Take 20 mEq by mouth daily.  06/27/17  Yes [provider]  QUEtiapine (SEROQUEL) 100 MG tablet Take 50-100 mg by mouth at bedtime as needed. 12/10/19  Yes [provider]  traZODone (DESYREL) 100 MG tablet Take 100 mg by mouth at bedtime.  12/08/10  Yes [provider]    Physical Exam: Vitals:   12/14/19 1753 12/14/19 1755 12/14/19 1859  BP:  (!) 80/47 (!) 85/46  Pulse:  76 78  Resp:  20 20  Temp: 98.1 F (36.7 C)  TempSrc: Oral    SpO2:  100% 97%  Weight: 54.4 kg    Height: 5\' 3"  (1.6 m)      Constitutional: No acute distress Head: Atraumatic Eyes: Conjunctiva clear ENM: dry mucous membranes. Normal dentition.  Neck: Supple Respiratory: Clear to auscultation bilaterally, no wheezing/rales/rhonchi. Normal respiratory effort. No accessory muscle use. . Cardiovascular: Regular rate and rhythm. No murmurs/rubs/gallops. Abdomen: Non-tender, non-distended. No masses. No rebound or  guarding. Positive bowel sounds. Musculoskeletal: No joint deformity upper and lower extremities. Normal ROM, no contractures. Normal muscle tone.  Skin: No rashes or ulcers. Few scattered bruises Extremities: No peripheral edema. Palpable peripheral pulses. Neurologic: Alert, moving all 4 extremities. perrl   Labs on Admission: I have personally reviewed following labs and imaging studies  CBC: Recent Labs  Lab 12/14/19 1811  WBC 8.6  NEUTROABS 5.2  HGB 9.9*  HCT 31.0*  MCV 94.2  PLT 161   Basic Metabolic Panel: Recent Labs  Lab 12/14/19 1811 12/14/19 2106  NA 136  --   K 4.7  --   CL 104  --   CO2 19*  --   GLUCOSE 99  --   BUN 73*  --   CREATININE 6.63*  --   CALCIUM 8.8*  --   MG  --  1.9   GFR: Estimated Creatinine Clearance: 7.3 mL/min (A) (by C-G formula based on SCr of 6.63 mg/dL (H)). Liver Function Tests: Recent Labs  Lab 12/14/19 1811  AST 17  ALT 14  ALKPHOS 38  BILITOT 0.7  PROT 7.9  ALBUMIN 3.8   No results for input(s): LIPASE, AMYLASE in the last 168 hours. No results for input(s): AMMONIA in the last 168 hours. Coagulation Profile: No results for input(s): INR, PROTIME in the last 168 hours. Cardiac Enzymes: No results for input(s): CKTOTAL, CKMB, CKMBINDEX, TROPONINI in the last 168 hours. BNP (last 3 results) No results for input(s): PROBNP in the last 8760 hours. HbA1C: No results for input(s): HGBA1C in the last 72 hours. CBG: No results for input(s): GLUCAP in the last 168 hours. Lipid Profile: No results for input(s): CHOL, HDL, LDLCALC, TRIG, CHOLHDL, LDLDIRECT in the last 72 hours. Thyroid Function Tests: No results for input(s): TSH, T4TOTAL, FREET4, T3FREE, THYROIDAB in the last 72 hours. Anemia Panel: No results for input(s): VITAMINB12, FOLATE, FERRITIN, TIBC, IRON, RETICCTPCT in the last 72 hours. Urine analysis: No results found for: COLORURINE, APPEARANCEUR, [place], [place], GLUCOSEU, HGBUR, BILIRUBINUR, KETONESUR,  PROTEINUR, UROBILINOGEN, NITRITE, LEUKOCYTESUR  Radiological Exams on Admission: CT Head Wo Contrast  Result Date: 12/14/2019 CLINICAL DATA:  Increased falls. EXAM: CT HEAD WITHOUT CONTRAST TECHNIQUE: Contiguous axial images were obtained from the base of the skull through the vertex without intravenous contrast. COMPARISON:  None. FINDINGS: Brain: There is mild cerebral atrophy with widening of the extra-axial spaces and ventricular dilatation. There are areas of decreased attenuation within the white matter tracts of the supratentorial brain, consistent with microvascular disease changes. Vascular: No hyperdense vessel or unexpected calcification. Skull: Normal. Negative for fracture or focal lesion. Sinuses/Orbits: No acute finding. Other: Mild scalp soft tissue swelling is seen along the posterior aspect of the vertex on the right. IMPRESSION: 1. Generalized cerebral atrophy. 2. No acute intracranial abnormality. Electronically Signed   By: Virgina Norfolk M.D.   On: 12/14/2019 18:51    EKG: Independently reviewed. nsr  Assessment/Plan Active Problems:   Acute kidney injury superimposed on CKD (H[facility])   CKD (chronic kidney disease) stage 3, GFR 30-59 ml/min  Bipolar disorder ([place])   HTN (hypertension)   History of intravenous drug abuse ([place])   AKI (acute kidney injury) ([place])   # Acute kidney injury - cr 6.63 from baseline of about 1.5 most recently checked June of this year (I work for [employer] and so was able to access her [facility] EMR). With elevated BUN. In setting of decreased PO and possible misuse of medications, suspect prerenal etiology. S/p 1 L NS in the ED. BPs low normal, but hemodynamically stable and awake and alert. K normal. Uremia could explain (or at least contribute to) recent worsening altered mental status and frequent falls. CT head here negative. - another 1 L ordered and then maintenance fluids at 125 - hold lasix, lisinopril, gabapentin - am  repeat bmp - telemetry overnight - f/u UDS, urinalysis - further w/u if fails to respond to fluid resuscitation and holding of above meds  # Unintentional weight loss - likely multifactorial: uremia, medication misuse, possible drug use, poor PO, possible underlying health problem. Normal lung cancer screen 04/2019. Normal pap 2019. Mult polyps on 2019 colonoscopy with plan for repeat in 3-5 years. Not utd mammogram - f/u tsh, a1c, hiv; will need outpt mammo - f/u uds - at risk for wernicke, given encephalopathy and gait dysfunction will elect to treat w/ thiamine 500 tid for 2 days  # Anemia - h 9.9, no recent labs available, last check [facility] was 2015 and was wnl then. Normocytic - f/u iron panel, b12/folate  # HTN - here bp low normal - hold lasix and lsinopril as above  # hypokalemia - hold home Kcl in setting of aki  # Opioid dependence - subutex 8 mg bid for home suboxone 8/2 bid  # MDD - home fluoxetine and trazodone  DVT prophylaxis: heparin Code Status: full  Family Communication: mother, updated telephonically  Disposition Plan: tbd  Consults called: none  Admission status: med/surg tele    Desma Maxim MD [facility] Pager [phone]  If 7PM-7AM, please contact night-coverage [email or web address] Password [email or web address]  12/15/2019, 12:01 AM

## 2019-12-15 NOTE — Progress Notes (Signed)
PROGRESS NOTE  Andrea Hobbs GGY:[id number] DOB: 1955-06-27   PCP: Elisabeth Cara, NP  Patient is from: Home.  DOA: 12/14/2019 LOS: 1  Brief Narrative / Interim history: 64 year old female with history of bipolar disorder, opiate dependence and IV heroin use now on Suboxone, HCV s/p chemo, HTN, CKD-3A and tobacco use disorder presenting with worsening confusion, ataxia, ambulatory dysfunction and frequent falls for the last 2 months.  Reportedly started on gabapentin about 2 months ago by PCP.  She also reports about 14 pound weight loss in the last 2 months, and 55 pound weight loss in the last 2 years.  In ED, BP 80/47> 102/65.Marland Kitchen  Other vital signs within normal.  Hgb 9.9. Cr 6.63 (1.5 at baseline), BUN 73.  Bicarb 19.  Otherwise CBC and CMP without significant finding.  Without acute finding.  EKG sinus rhythm with occasional PVCs.  UDS negative.  Patient was started on IV fluids and admitted for acute on chronic renal failure/azotemia, ambulatory dysfunction/frequent fall and unintentional weight loss.  Subjective: Seen and examined earlier this morning.  No major events overnight of this morning.  No complaints other than ongoing ataxia/jerking.  She denies fever, chills, chest pain, dyspnea, cough, vomiting, abdominal pain, melena and hematochezia.  Reports smoking about 5 to cigarettes a day.  Objective: Vitals:   12/15/19 0400 12/15/19 0600 12/15/19 0917 12/15/19 1000  BP: (!) 98/56 102/68  (!) 87/60  Pulse: 71 71 83 79  Resp: 12 14 18 11   Temp:      TempSrc:   Oral   SpO2: 96% 92% 95% 94%  Weight:      Height:        Intake/Output Summary (Last 24 hours) at 12/15/2019 1155 Last data filed at 12/15/2019 0013 Gross per 24 hour  Intake 500.03 ml  Output 800 ml  Net -299.97 ml   Filed Weights   12/14/19 1753  Weight: 54.4 kg    Examination:  GENERAL: No apparent distress.  Nontoxic. HEENT: MMM.  Vision and hearing grossly intact.  NECK: Supple.  No apparent JVD.    RESP:  No IWOB.  Fair aeration bilaterally. CVS:  RRR. Heart sounds normal.  ABD/GI/GU: BS+. Abd soft, NTND.  MSK/EXT:  Moves extremities. No apparent deformity. No edema.  SKIN: no apparent skin lesion or wound NEURO: Awake, alert and oriented appropriately.  No apparent focal neuro deficit other than intermittent ataxia PSYCH: Calm. Normal affect.  Procedures:  None  Microbiology summarized: COVID-19 PCR negative.  Assessment & Plan: AKI on CKD-3A with azotemia: Cr 1.5 in 09/2019 per admitting provider who had access to patient's EMR elsewhere.  Cr 6.63 on admit > 5.7.  BUN 73 > 65.  AKI likely prerenal in the setting of poor p.o. intake.  She is not on nephrotoxic meds or NSAIDs. -Check CK given history of recurrent fall -Check renal ultrasound to exclude obstructive etiology -Continue IV fluid -Hold home lisinopril and gabapentin -Avoid or minimize nephrotoxic meds  Ataxia/ambulatory dysfunction/recurrent fall-could be due to gabapentin in the setting of renal failure/uremia. -Hold gabapentin -Treat renal failure as above -PT/OT eval  Unintentional weight loss: Likely due to poor p.o. intake.  No other constitutional symptoms.  Per admitting provider, normal lung cancer screen 04/2019, normal pap 2019, multiple polyps on 2019 colonoscopy with plan for repeat in 3-5 years.  She is not up-to-date on mammogram. Wt Readings from Last 10 Encounters:  12/14/19 54.4 kg  05/29/19 59.9 kg  05/02/18 74.8 kg  10/24/17 74.8 kg  07/27/17 79.3 kg  06/15/17 79.4 kg  -Consult dietitian.  Subacute metabolic encephalopathy-likely due to gabapentin and renal failure.  Vitamin B12 276.  She is oriented x4 but slow response.  No apparent focal neuro deficit other than ataxia.  TSH and CT head without acute finding. -Started on high-dose thiamine for 2 days out of concern for possible [place]-we will continue -Manage renal failure as above. -Oral vitamin B12  Normocytic anemia: Hgb 9.9>  9.8.  Anemia panel within normal.  Denies melena or hematochezia. -Continue monitoring  Hypotension: Soft blood pressures.  Together with hyperglycemia, could raise some concern for adrenal insufficiency. -Check a.m. cortisol and ACTH stimulation test -Continue IV fluid  Hyperglycemia: CBG 57 earlier this morning but improved to 76. -Monitor intermittently  Hypokalemia: Resolved.  Opioid dependence -subutex 8 mg bid for home suboxone 8/2 BID  Tobacco use disorder: Reports smoking about 5 cigarettes a day. -Encourage cessation -Nicotine patch  MDD: Stable. -Continue home medications   Body mass index is 21.26 kg/m.         DVT prophylaxis:  heparin injection 5,000 Units Start: 12/15/19 0600  Code Status: Full code Family Communication: Patient and/or RN. Available if any question.  Status is: Inpatient  Remains inpatient appropriate because:Hemodynamically unstable, IV treatments appropriate due to intensity of illness or inability to take PO and Inpatient level of care appropriate due to severity of illness.  AKI with azotemia, ambulatory dysfunction and hypotension   Dispo: The patient is from: Home              Anticipated d/c is to: Home              Anticipated d/c date is: 3 days              Patient currently is not medically stable to d/c.       Consultants:  None   Sch Meds:  Scheduled Meds: . buprenorphine  8 mg Sublingual BID  . FLUoxetine  60 mg Oral Daily  . heparin  5,000 Units Subcutaneous Q8H  . nicotine  14 mg Transdermal Daily  . traZODone  100 mg Oral QHS   Continuous Infusions: . sodium chloride 125 mL/hr at 12/15/19 0235  . thiamine injection 500 mg (12/15/19 1100)   PRN Meds:.  Antimicrobials: Anti-infectives (From admission, onward)   None       I have personally reviewed the following labs and images: CBC: Recent Labs  Lab 12/14/19 1811 12/15/19 0223 12/15/19 0355  WBC 8.6 8.0 8.0  NEUTROABS 5.2  --   --    HGB 9.9* 10.5* 9.8*  HCT 31.0* 32.8* 30.5*  MCV 94.2 94.8 95.0  PLT 293 277 244   BMP &GFR Recent Labs  Lab 12/14/19 1811 12/14/19 2106 12/15/19 0223 12/15/19 0355  NA 136  --   --  140  K 4.7  --   --  4.5  CL 104  --   --  112*  CO2 19*  --   --  19*  GLUCOSE 99  --   --  67*  BUN 73*  --   --  65*  CREATININE 6.63*  --  6.17* 5.70*  CALCIUM 8.8*  --   --  7.8*  MG  --  1.9  --   --    Estimated Creatinine Clearance: 8.5 mL/min (A) (by C-G formula based on SCr of 5.7 mg/dL (H)). Liver & Pancreas: Recent Labs  Lab 12/14/19 1811  AST 17  ALT 14  ALKPHOS 38  BILITOT 0.7  PROT 7.9  ALBUMIN 3.8   No results for input(s): LIPASE, AMYLASE in the last 168 hours. No results for input(s): AMMONIA in the last 168 hours. Diabetic: No results for input(s): HGBA1C in the last 72 hours. Recent Labs  Lab 12/15/19 0512 12/15/19 0536  GLUCAP 57* 76   Cardiac Enzymes: No results for input(s): CKTOTAL, CKMB, CKMBINDEX, TROPONINI in the last 168 hours. No results for input(s): PROBNP in the last 8760 hours. Coagulation Profile: No results for input(s): INR, PROTIME in the last 168 hours. Thyroid Function Tests: Recent Labs    12/15/19 0355  TSH 1.812   Lipid Profile: No results for input(s): CHOL, HDL, LDLCALC, TRIG, CHOLHDL, LDLDIRECT in the last 72 hours. Anemia Panel: Recent Labs    12/15/19 0223  VITAMINB12 276  FOLATE 17.7  FERRITIN 148  TIBC 288  IRON 59   Urine analysis:    Component Value Date/Time   COLORURINE YELLOW (A) 12/14/2019 2106   APPEARANCEUR CLOUDY (A) 12/14/2019 2106   LABSPEC 1.005 12/14/2019 2106   PHURINE 6.0 12/14/2019 2106   GLUCOSEU NEGATIVE 12/14/2019 2106   HGBUR SMALL (A) 12/14/2019 2106   BILIRUBINUR NEGATIVE 12/14/2019 2106   KETONESUR NEGATIVE 12/14/2019 2106   PROTEINUR NEGATIVE 12/14/2019 2106   NITRITE NEGATIVE 12/14/2019 2106   LEUKOCYTESUR MODERATE (A) 12/14/2019 2106   Sepsis Labs: Invalid input(s): PROCALCITONIN,  Sidney  Microbiology: Recent Results (from the past 240 hour(s))  SARS Coronavirus 2 by RT PCR (hospital order, performed in [facility] hospital lab) Nasopharyngeal Nasopharyngeal Swab     Status: None   Collection Time: 12/14/19 10:22 PM   Specimen: Nasopharyngeal Swab  Result Value Ref Range Status   SARS Coronavirus 2 NEGATIVE NEGATIVE Final    Comment: (NOTE) SARS-CoV-2 target nucleic acids are NOT DETECTED.  The SARS-CoV-2 RNA is generally detectable in upper and lower respiratory specimens during the acute phase of infection. The lowest concentration of SARS-CoV-2 viral copies this assay can detect is 250 copies / mL. A negative result does not preclude SARS-CoV-2 infection and should not be used as the sole basis for treatment or other patient management decisions.  A negative result may occur with improper specimen collection / handling, submission of specimen other than nasopharyngeal swab, presence of viral mutation(s) within the areas targeted by this assay, and inadequate number of viral copies (<250 copies / mL). A negative result must be combined with clinical observations, patient history, and epidemiological information.  Fact Sheet for Patients:   StrictlyIdeas.no  Fact Sheet for Healthcare Providers: BankingDealers.co.za  This test is not yet approved or  cleared by the [place] FDA and has been authorized for detection and/or diagnosis of SARS-CoV-2 by FDA under an Emergency Use Authorization (EUA).  This EUA will remain in effect (meaning this test can be used) for the duration of the COVID-19 declaration under Section 564(b)(1) of the Act, 21 U.S.C. section 360bbb-3(b)(1), unless the authorization is terminated or revoked sooner.  Performed at [facility], [address], [place], [place] [address]     Radiology Studies: CT Head Wo Contrast  Result Date: 12/14/2019 CLINICAL DATA:   Increased falls. EXAM: CT HEAD WITHOUT CONTRAST TECHNIQUE: Contiguous axial images were obtained from the base of the skull through the vertex without intravenous contrast. COMPARISON:  None. FINDINGS: Brain: There is mild cerebral atrophy with widening of the extra-axial spaces and ventricular dilatation. There are areas of decreased attenuation within the white matter  tracts of the supratentorial brain, consistent with microvascular disease changes. Vascular: No hyperdense vessel or unexpected calcification. Skull: Normal. Negative for fracture or focal lesion. Sinuses/Orbits: No acute finding. Other: Mild scalp soft tissue swelling is seen along the posterior aspect of the vertex on the right. IMPRESSION: 1. Generalized cerebral atrophy. 2. No acute intracranial abnormality. Electronically Signed   By: Virgina Norfolk M.D.   On: 12/14/2019 18:51   US RENAL  Result Date: 12/15/2019 CLINICAL DATA:  Acute kidney injury EXAM: RENAL / URINARY TRACT ULTRASOUND COMPLETE COMPARISON:  None. FINDINGS: Right Kidney: Renal measurements: 9.0 x 4.7 x 5.1 cm = volume: 114 mL. Increased cortical echogenicity. No mass or hydronephrosis visualized. Left Kidney: Renal measurements: 11.0 x 6.7 x 5.0 cm = volume: 197 mL. Increased cortical echogenicity. No mass or hydronephrosis visualized. Bladder: Partially collapsed bladder with bladder wall thickening and trabeculation. Other: None. IMPRESSION: 1. No hydronephrosis. 2. Bilateral echogenic kidneys compatible with chronic medical renal disease. Electronically Signed   By: Kerby Moors M.D.   On: 12/15/2019 10:24      Andrea Hobbs T. [place]  If 7PM-7AM, please contact night-coverage [email or web address] 12/15/2019, 11:55 AM

## 2019-12-16 ENCOUNTER — Encounter: Payer: Self-pay | Admitting: Obstetrics and Gynecology

## 2019-12-16 LAB — CBC
HCT: 25 % — ABNORMAL LOW (ref 36.0–46.0)
Hemoglobin: 8.6 g/dL — ABNORMAL LOW (ref 12.0–15.0)
MCH: 31.2 pg (ref 26.0–34.0)
MCHC: 34.4 g/dL (ref 30.0–36.0)
MCV: 90.6 fL (ref 80.0–100.0)
Platelets: 230 10*3/uL (ref 150–400)
RBC: 2.76 MIL/uL — ABNORMAL LOW (ref 3.87–5.11)
RDW: 13.4 % (ref 11.5–15.5)
WBC: 7.2 10*3/uL (ref 4.0–10.5)
nRBC: 0 % (ref 0.0–0.2)

## 2019-12-16 LAB — ACTH STIMULATION, 3 TIME POINTS
Cortisol, 30 Min: 24.1 ug/dL
Cortisol, 60 Min: 28.5 ug/dL
Cortisol, Base: 5.4 ug/dL

## 2019-12-16 LAB — RENAL FUNCTION PANEL
Albumin: 2.5 g/dL — ABNORMAL LOW (ref 3.5–5.0)
Anion gap: 7 (ref 5–15)
BUN: 55 mg/dL — ABNORMAL HIGH (ref 8–23)
CO2: 18 mmol/L — ABNORMAL LOW (ref 22–32)
Calcium: 7.8 mg/dL — ABNORMAL LOW (ref 8.9–10.3)
Chloride: 114 mmol/L — ABNORMAL HIGH (ref 98–111)
Creatinine, Ser: 3.71 mg/dL — ABNORMAL HIGH (ref 0.44–1.00)
GFR calc Af Amer: 14 mL/min — ABNORMAL LOW (ref >60)
GFR calc non Af Amer: 12 mL/min — ABNORMAL LOW (ref >60)
Glucose, Bld: 116 mg/dL — ABNORMAL HIGH (ref 70–99)
Phosphorus: 4.7 mg/dL — ABNORMAL HIGH (ref 2.5–4.6)
Potassium: 4 mmol/L (ref 3.5–5.1)
Sodium: 139 mmol/L (ref 135–145)

## 2019-12-16 LAB — GLUCOSE, CAPILLARY
Glucose-Capillary: 124 mg/dL — ABNORMAL HIGH (ref 70–99)
Glucose-Capillary: 127 mg/dL — ABNORMAL HIGH (ref 70–99)
Glucose-Capillary: 73 mg/dL (ref 70–99)
Glucose-Capillary: 77 mg/dL (ref 70–99)

## 2019-12-16 LAB — CORTISOL-AM, BLOOD: Cortisol - AM: 6.4 ug/dL — ABNORMAL LOW (ref 6.7–22.6)

## 2019-12-16 LAB — CORTISOL: Cortisol, Plasma: 7.3 ug/dL

## 2019-12-16 LAB — MAGNESIUM: Magnesium: 1.5 mg/dL — ABNORMAL LOW (ref 1.7–2.4)

## 2019-12-16 MED ORDER — NICOTINE 21 MG/24HR TD PT24
21.0000 mg | MEDICATED_PATCH | Freq: Every day | TRANSDERMAL | Status: DC
  Administered 2019-12-16 – 2019-12-17 (×2): 21 mg via TRANSDERMAL
  Filled 2019-12-16 (×3): qty 1

## 2019-12-16 MED ORDER — SODIUM BICARBONATE 8.4 % IV SOLN
INTRAVENOUS | Status: DC
  Filled 2019-12-16 (×5): qty 100

## 2019-12-16 MED ORDER — MAGNESIUM SULFATE 4 GM/100ML IV SOLN
4.0000 g | Freq: Once | INTRAVENOUS | Status: AC
  Administered 2019-12-16: 4 g via INTRAVENOUS
  Filled 2019-12-16: qty 100

## 2019-12-16 NOTE — Progress Notes (Addendum)
Hypotensive, ox 91 %.  VS after walking to restroom 94/61, pulse 72, ox 95%.

## 2019-12-16 NOTE — Progress Notes (Signed)
OT Cancellation Note  Patient Details Name: Andrea Hobbs MRN: [id number] DOB: 01/06/1956   Cancelled Treatment:    Reason Eval/Treat Not Completed: OT screened, no needs identified, will sign off. OT orders received and chart reviewed. Per conversation c PT, pt is at functional baseline c no skilled acute OT needs identified. Will sign off at this time. Please re-consult if new needs arise.   Dessie Coma, M.S. OTR/L  12/16/19, 12:14 PM  ascom [phone]

## 2019-12-16 NOTE — Treatment Plan (Signed)
Andrea Hobbs in tele called and notified that tele has been discontinued at this time.

## 2019-12-16 NOTE — Progress Notes (Signed)
PROGRESS NOTE    Andrea Hobbs  BWI:[id number] DOB: [birth year] DOA: 12/14/2019 PCP: Elisabeth Cara, NP   Chief complaint.  Confusion  Brief Narrative:  64 year old female with history of bipolar disorder, opiate dependence and IV heroin use now on Suboxone, HCV s/p chemo, HTN, CKD-3A and tobacco use disorder presenting with worsening confusion, ataxia, ambulatory dysfunction and frequent falls for the last 2 months.  Reportedly started on gabapentin about 2 months ago by PCP.  She also reports about 14 pound weight loss in the last 2 months, and 55 pound weight loss in the last 2 years.  Patient also complaining of diarrhea for the last 2 days, multiple loose stools without nausea vomiting.  No abdominal pain.  Patient normally has a very poor appetite, but no chronic diarrhea.  She eats 1 meal a day.  She also has chronic insomnia.  Upon arriving the emergency room, she was found to have acute on chronic renal failure.  Renal ultrasound showed chronic kidney disease, no obstruction.  She is placed on IV fluids.  8/29.  Renal function is getting better today.  Diarrhea essentially resolved.  Change to bicarb drip due to metabolic acidosis.   Assessment & Plan:   Active Problems:   Acute kidney injury superimposed on CKD (HCC)   CKD (chronic kidney disease) stage 3, GFR 30-59 ml/min   Bipolar disorder (HCC)   HTN (hypertension)   History of intravenous drug abuse ([place])   AKI (acute kidney injury) ([place])  #1.  Acute kidney injury on chronic kidney disease stage IIIa. Appear to be secondary to diarrhea and poor p.o. intake. Renal function improving after IV fluids. Renal ultrasound did not show any obstruction. IV fluids changed to bicarb drip due to metabolic acidosis. Hold off lisinopril, patient was not taking gabapentin at home.  2.  Ataxia.  Frequent falls. Patient states that the condition started after taking gabapentin.  Gabapentin will not be restarted.  3.  Unintentional  weight loss. Patient does not have chronic diarrhea.  She had a colonoscopy recently without significant abnormality.  She will need a EGD as outpatient.  However, her condition appears to be secondary to poor p.o. intake.  She has a habit of eating 1 meal a day.  She is advised to eat 3 meals a day.  4.  Transient hypotension. Blood pressure much better after fluids.  Cortisol level borderline.  ACTH stimulation test pending.  5.  Non anion gap metabolic acidosis. IV fluids changed to bicarb.  6.  Hypomagnesemia. Supplement.  7.  Anemia with borderline B12 level. Check homocystine level.    DVT prophylaxis: heparin Code Status: Full Family Communication: None Disposition Plan:  . Patient came from: Home           . Anticipated d/c place: Home . Barriers to d/c OR conditions which need to be met to effect a safe d/c:   Consultants:   None  Procedures:None Antimicrobials: None Subjective: Patient doing well today.  She had a diarrhea yesterday, diarrhea had resolved today.  No nausea vomiting.  Poor appetite. Denies any short of breath or cough. No fever or chills. No dysuria hematuria.  Objective: Vitals:   12/15/19 2139 12/16/19 0412 12/16/19 0432 12/16/19 1152  BP: 123/71 (!) 81/47 94/61 97/61   Pulse: 65 68 72 64  Resp: 18 16    Temp: 98.5 F (36.9 C) 97.8 F (36.6 C)  97.6 F (36.4 C)  TempSrc: Oral Oral  Oral  SpO2: 95% 91% 95% 93%  Weight:      Height:        Intake/Output Summary (Last 24 hours) at 12/16/2019 1250 Last data filed at 12/16/2019 2130 Gross per 24 hour  Intake 3913.89 ml  Output 500 ml  Net 3413.89 ml   Filed Weights   12/14/19 1753  Weight: 54.4 kg    Examination:  General exam: Appears calm and comfortable  Respiratory system: Clear to auscultation. Respiratory effort normal. Cardiovascular system: S1 & S2 heard, RRR. No JVD, murmurs, rubs, gallops or clicks. No pedal edema. Gastrointestinal system: Abdomen is nondistended,  soft and nontender. No organomegaly or masses felt. Normal bowel sounds heard. Central nervous system: Alert and oriented. No focal neurological deficits. Extremities: Symmetric  Skin: No rashes, lesions or ulcers Psychiatry:  Mood & affect appropriate.     Data Reviewed: I have personally reviewed following labs and imaging studies  CBC: Recent Labs  Lab 12/14/19 1811 12/15/19 0223 12/15/19 0355 12/16/19 0542  WBC 8.6 8.0 8.0 7.2  NEUTROABS 5.2  --   --   --   HGB 9.9* 10.5* 9.8* 8.6*  HCT 31.0* 32.8* 30.5* 25.0*  MCV 94.2 94.8 95.0 90.6  PLT 293 277 244 865   Basic Metabolic Panel: Recent Labs  Lab 12/14/19 1811 12/14/19 2106 12/15/19 0223 12/15/19 0355 12/16/19 0542  NA 136  --   --  140 139  K 4.7  --   --  4.5 4.0  CL 104  --   --  112* 114*  CO2 19*  --   --  19* 18*  GLUCOSE 99  --   --  67* 116*  BUN 73*  --   --  65* 55*  CREATININE 6.63*  --  6.17* 5.70* 3.71*  CALCIUM 8.8*  --   --  7.8* 7.8*  MG  --  1.9  --   --  1.5*  PHOS  --   --   --   --  4.7*   GFR: Estimated Creatinine Clearance: 13 mL/min (A) (by C-G formula based on SCr of 3.71 mg/dL (H)). Liver Function Tests: Recent Labs  Lab 12/14/19 1811 12/16/19 0542  AST 17  --   ALT 14  --   ALKPHOS 38  --   BILITOT 0.7  --   PROT 7.9  --   ALBUMIN 3.8 2.5*   No results for input(s): LIPASE, AMYLASE in the last 168 hours. No results for input(s): AMMONIA in the last 168 hours. Coagulation Profile: No results for input(s): INR, PROTIME in the last 168 hours. Cardiac Enzymes: No results for input(s): CKTOTAL, CKMB, CKMBINDEX, TROPONINI in the last 168 hours. BNP (last 3 results) No results for input(s): PROBNP in the last 8760 hours. HbA1C: Recent Labs    12/15/19 0355  HGBA1C 6.3*   CBG: Recent Labs  Lab 12/15/19 0512 12/15/19 0536 12/16/19 0026 12/16/19 0750  GLUCAP 57* 76 73 127*   Lipid Profile: No results for input(s): CHOL, HDL, LDLCALC, TRIG, CHOLHDL, LDLDIRECT in the  last 72 hours. Thyroid Function Tests: Recent Labs    12/15/19 0355  TSH 1.812   Anemia Panel: Recent Labs    12/15/19 0223  VITAMINB12 276  FOLATE 17.7  FERRITIN 148  TIBC 288  IRON 59   Sepsis Labs: Recent Labs  Lab 12/14/19 2106  LATICACIDVEN 0.6    Recent Results (from the past 240 hour(s))  SARS Coronavirus 2 by RT PCR (hospital order, performed in [facility] hospital lab) Nasopharyngeal Nasopharyngeal  Swab     Status: None   Collection Time: 12/14/19 10:22 PM   Specimen: Nasopharyngeal Swab  Result Value Ref Range Status   SARS Coronavirus 2 NEGATIVE NEGATIVE Final    Comment: (NOTE) SARS-CoV-2 target nucleic acids are NOT DETECTED.  The SARS-CoV-2 RNA is generally detectable in upper and lower respiratory specimens during the acute phase of infection. The lowest concentration of SARS-CoV-2 viral copies this assay can detect is 250 copies / mL. A negative result does not preclude SARS-CoV-2 infection and should not be used as the sole basis for treatment or other patient management decisions.  A negative result may occur with improper specimen collection / handling, submission of specimen other than nasopharyngeal swab, presence of viral mutation(s) within the areas targeted by this assay, and inadequate number of viral copies (<250 copies / mL). A negative result must be combined with clinical observations, patient history, and epidemiological information.  Fact Sheet for Patients:   StrictlyIdeas.no  Fact Sheet for Healthcare Providers: BankingDealers.co.za  This test is not yet approved or  cleared by the [place] FDA and has been authorized for detection and/or diagnosis of SARS-CoV-2 by FDA under an Emergency Use Authorization (EUA).  This EUA will remain in effect (meaning this test can be used) for the duration of the COVID-19 declaration under Section 564(b)(1) of the Act, 21 U.S.C. section  360bbb-3(b)(1), unless the authorization is terminated or revoked sooner.  Performed at [facility], [address] [place], [place] [address]          Radiology Studies: CT Head Wo Contrast  Result Date: 12/14/2019 CLINICAL DATA:  Increased falls. EXAM: CT HEAD WITHOUT CONTRAST TECHNIQUE: Contiguous axial images were obtained from the base of the skull through the vertex without intravenous contrast. COMPARISON:  None. FINDINGS: Brain: There is mild cerebral atrophy with widening of the extra-axial spaces and ventricular dilatation. There are areas of decreased attenuation within the white matter tracts of the supratentorial brain, consistent with microvascular disease changes. Vascular: No hyperdense vessel or unexpected calcification. Skull: Normal. Negative for fracture or focal lesion. Sinuses/Orbits: No acute finding. Other: Mild scalp soft tissue swelling is seen along the posterior aspect of the vertex on the right. IMPRESSION: 1. Generalized cerebral atrophy. 2. No acute intracranial abnormality. Electronically Signed   By: Virgina Norfolk M.D.   On: 12/14/2019 18:51   US RENAL  Result Date: 12/15/2019 CLINICAL DATA:  Acute kidney injury EXAM: RENAL / URINARY TRACT ULTRASOUND COMPLETE COMPARISON:  None. FINDINGS: Right Kidney: Renal measurements: 9.0 x 4.7 x 5.1 cm = volume: 114 mL. Increased cortical echogenicity. No mass or hydronephrosis visualized. Left Kidney: Renal measurements: 11.0 x 6.7 x 5.0 cm = volume: 197 mL. Increased cortical echogenicity. No mass or hydronephrosis visualized. Bladder: Partially collapsed bladder with bladder wall thickening and trabeculation. Other: None. IMPRESSION: 1. No hydronephrosis. 2. Bilateral echogenic kidneys compatible with chronic medical renal disease. Electronically Signed   By: Kerby Moors M.D.   On: 12/15/2019 10:24        Scheduled Meds: . buprenorphine  8 mg Sublingual BID  . FLUoxetine  60 mg Oral Daily  .  heparin  5,000 Units Subcutaneous Q8H  . nicotine  14 mg Transdermal Daily  . traZODone  100 mg Oral QHS  . vitamin B-12  1,000 mcg Oral Daily   Continuous Infusions: . sodium bicarbonate in D5W 1000 mL infusion 100 mL/hr at 12/16/19 0851  . thiamine injection 500 mg (12/16/19 1012)  LOS: 2 days    Time spent: 28 minutes    Sharen Hones, MD [facility]   To contact the attending provider between 7A-7P or the covering provider during after hours 7P-7A, please log into the web site [email or web address] and access using universal [place] password for that web site. If you do not have the password, please call the hospital operator.  12/16/2019, 12:50 PM

## 2019-12-16 NOTE — Evaluation (Signed)
Physical Therapy Evaluation Patient Details Name: Andrea Hobbs MRN: [id number] DOB: 11-Aug-1955 Today's Date: 12/16/2019   History of Present Illness  Andrea Hobbs is a 62yoF who comes to [facility] on 8/27 after several falls over 2 weeks, upon arrival pt also notes difficulty with mentation. Pt has had recent medication changes. Pt's mother concerned about pt being on too many medications. PMH: BPD, opioid dependence, IV heroin use now on suboxone, HTN, CKD3.  Clinical Impression  Pt admitted with above diagnosis. Pt currently without functional limitations. Upon entry, pt in bed, awake and agreeable to participate. The pt is alert and oriented x4, pleasant, conversational, and generally a good historian. Pt performs all mobility independently, balance screening offers little to no risk of falls. Pt reports that all was secondary to medication issues, and that she feels much more clear and safe now. Patient is at baseline, all education completed, and time is given to address all questions/concerns. No additional skilled PT services needed at this time, PT signing off. PT recommends daily ambulation ad lib or with nursing staff as needed to prevent deconditioning.       Follow Up Recommendations No PT follow up    Equipment Recommendations  None recommended by PT    Recommendations for Other Services       Precautions / Restrictions Precautions Precautions: Fall Restrictions Weight Bearing Restrictions: No      Mobility  Bed Mobility Overal bed mobility: Independent                Transfers Overall transfer level: Independent                  Ambulation/Gait Ambulation/Gait assistance:  (deferred, RN giving meds; RN reports pt AMB independently in room)              Stairs            Wheelchair Mobility    Modified Rankin (Stroke Patients Only)       Balance Overall balance assessment: History of Falls;Independent (able to march in place  unsupoprted, stand c eyes closed x15 seconds)                                           Pertinent Vitals/Pain Pain Assessment: Faces Faces Pain Scale: Hurts a little bit Pain Location: Right knee wound/scrape Pain Intervention(s): Limited activity within patient's tolerance;Monitored during session    [place] expects to be discharged to:: Private residence Living Arrangements: Parent (Mother and Demented father) Available Help at Discharge: Family Type of Home: Mobile home Home Access: Ramped entrance     Home Layout: One level Home Equipment: None      Prior Function Level of Independence: Independent               Hand Dominance        Extremity/Trunk Assessment   Upper Extremity Assessment Upper Extremity Assessment: Overall WFL for tasks assessed    Lower Extremity Assessment Lower Extremity Assessment: Overall WFL for tasks assessed       Communication   Communication: No difficulties  Cognition Arousal/Alertness: Awake/alert Behavior During Therapy: WFL for tasks assessed/performed Overall Cognitive Status: Within Functional Limits for tasks assessed  General Comments      Exercises     Assessment/Plan    PT Assessment Patent does not need any further PT services  PT Problem List         PT Treatment Interventions      PT Goals (Current goals can be found in the Care Plan section)  Acute Rehab PT Goals PT Goal Formulation: All assessment and education complete, DC therapy    Frequency     Barriers to discharge        Co-evaluation               AM-PAC PT "6 Clicks" Mobility  Outcome Measure Help needed turning from your back to your side while in a flat bed without using bedrails?: None Help needed moving from lying on your back to sitting on the side of a flat bed without using bedrails?: None Help needed moving to and from a bed  to a chair (including a wheelchair)?: None Help needed standing up from a chair using your arms (e.g., wheelchair or bedside chair)?: None Help needed to walk in hospital room?: None Help needed climbing 3-5 steps with a railing? : None 6 Click Score: 24    End of Session   Activity Tolerance: Patient tolerated treatment well;No increased pain Patient left: in chair;with nursing/sitter in room;with call bell/phone within reach   PT Visit Diagnosis: History of falling (Z91.81);Other abnormalities of gait and mobility (R26.89)    Time: 1572-6203 PT Time Calculation (min) (ACUTE ONLY): 14 min   Charges:   PT Evaluation $PT Eval Low Complexity: 1 Low          12:09 PM, 12/16/19 Etta Grandchild, PT, DPT Physical Therapist - [facility]  [phone] ([place])    [place] C 12/16/2019, 12:07 PM

## 2019-12-17 LAB — BASIC METABOLIC PANEL
Anion gap: 5 (ref 5–15)
BUN: 46 mg/dL — ABNORMAL HIGH (ref 8–23)
CO2: 27 mmol/L (ref 22–32)
Calcium: 8.1 mg/dL — ABNORMAL LOW (ref 8.9–10.3)
Chloride: 107 mmol/L (ref 98–111)
Creatinine, Ser: 2.63 mg/dL — ABNORMAL HIGH (ref 0.44–1.00)
GFR calc Af Amer: 22 mL/min — ABNORMAL LOW (ref >60)
GFR calc non Af Amer: 19 mL/min — ABNORMAL LOW (ref >60)
Glucose, Bld: 90 mg/dL (ref 70–99)
Potassium: 3.7 mmol/L (ref 3.5–5.1)
Sodium: 139 mmol/L (ref 135–145)

## 2019-12-17 LAB — CBC WITH DIFFERENTIAL/PLATELET
Abs Immature Granulocytes: 0.02 10*3/uL (ref 0.00–0.07)
Basophils Absolute: 0 10*3/uL (ref 0.0–0.1)
Basophils Relative: 0 %
Eosinophils Absolute: 0.3 10*3/uL (ref 0.0–0.5)
Eosinophils Relative: 3 %
HCT: 26.8 % — ABNORMAL LOW (ref 36.0–46.0)
Hemoglobin: 8.9 g/dL — ABNORMAL LOW (ref 12.0–15.0)
Immature Granulocytes: 0 %
Lymphocytes Relative: 42 %
Lymphs Abs: 3.8 10*3/uL (ref 0.7–4.0)
MCH: 30.8 pg (ref 26.0–34.0)
MCHC: 33.2 g/dL (ref 30.0–36.0)
MCV: 92.7 fL (ref 80.0–100.0)
Monocytes Absolute: 0.6 10*3/uL (ref 0.1–1.0)
Monocytes Relative: 7 %
Neutro Abs: 4.4 10*3/uL (ref 1.7–7.7)
Neutrophils Relative %: 48 %
Platelets: 258 10*3/uL (ref 150–400)
RBC: 2.89 MIL/uL — ABNORMAL LOW (ref 3.87–5.11)
RDW: 13.3 % (ref 11.5–15.5)
WBC: 9.2 10*3/uL (ref 4.0–10.5)
nRBC: 0 % (ref 0.0–0.2)

## 2019-12-17 LAB — PHOSPHORUS: Phosphorus: 3.4 mg/dL (ref 2.5–4.6)

## 2019-12-17 LAB — GLUCOSE, CAPILLARY
Glucose-Capillary: 99 mg/dL (ref 70–99)
Glucose-Capillary: 130 mg/dL — ABNORMAL HIGH (ref 70–99)
Glucose-Capillary: 92 mg/dL (ref 70–99)
Glucose-Capillary: 88 mg/dL (ref 70–99)

## 2019-12-17 LAB — MAGNESIUM: Magnesium: 2.2 mg/dL (ref 1.7–2.4)

## 2019-12-17 MED ORDER — ADULT MULTIVITAMIN W/MINERALS CH
1.0000 | ORAL_TABLET | Freq: Every day | ORAL | Status: DC
  Administered 2019-12-18: 1 via ORAL
  Filled 2019-12-17: qty 1

## 2019-12-17 MED ORDER — THIAMINE HCL 100 MG PO TABS
100.0000 mg | ORAL_TABLET | Freq: Every day | ORAL | Status: DC
  Administered 2019-12-17 – 2019-12-18 (×2): 100 mg via ORAL
  Filled 2019-12-17 (×2): qty 1

## 2019-12-17 MED ORDER — LACTATED RINGERS IV SOLN: INTRAVENOUS | Status: DC

## 2019-12-17 MED ORDER — ENSURE ENLIVE PO LIQD
237.0000 mL | Freq: Two times a day (BID) | ORAL | Status: DC
  Administered 2019-12-17 – 2019-12-18 (×2): 237 mL via ORAL

## 2019-12-17 NOTE — Care Management Important Message (Signed)
Important Message  Patient Details  Name: Andrea Hobbs MRN: [id number] Date of Birth: 1955-06-10   Medicare Important Message Given:  Yes  Initial Medicare IM given by Patient Access Associate on 12/16/2019 at 8:27am.     Dannette Barbara 12/17/2019, 8:42 AM

## 2019-12-17 NOTE — Progress Notes (Signed)
Initial Nutrition Assessment  DOCUMENTATION CODES:   Not applicable  INTERVENTION:  Provide Ensure Enlive po BID, each supplement provides 350 kcal and 20 grams of protein.  Provide MVI daily.  Continue thiamine 100 mg daily and vitamin B12 1000 micrograms daily.  Encouraged adequate intake of calories and protein at meals. Encouraged patient to eat 3 well-balanced meals throughout the day with adequate source of protein at each meal.  NUTRITION DIAGNOSIS:   Inadequate oral intake (PTA) related to decreased appetite as evidenced by per patient/family report, 9.2% weight loss over almost 9 months.  GOAL:   Patient will meet greater than or equal to 90% of their needs  MONITOR:   PO intake, Supplement acceptance, Labs, Weight trends, I & O's  REASON FOR ASSESSMENT:   Malnutrition Screening Tool, Consult Assessment of nutrition requirement/status  ASSESSMENT:   64 year old female with PMHx of depression, HTN, bipolar disorder, opiate dependence and IV heroin use now on Suboxone admitted with AKI on CKD stage III, ataxia and frequent falls, unintentional weight loss, diarrhea.   Met with patient at bedside. Patient reports that lately she has not been eating well and has only been eating one meal per day. 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 She got tired of preparing meals and started eating less at meals. She reports lately she may have [employer] take out or another simple meal late at night. She also snacks late at night. Patient reports she is eating well here. According to chart she is eating 75-100% of her meals. Patient reports she understands she needs to eat more meals throughout the day. Discussed options at home that would be easier for her to prepare. Encouraged adequate intake of protein at each meal. Patient also amenable to drinking Ensure to help meet calorie/protein needs.  Patient reports her UBW was 185 lbs. Do not see  a weight that high documented in chart. According to chart patient was 74.8 kg on 10/24/2017 so weight loss has occurred over time. She was 59.9 kg on 05/29/2019 and is currently 54.4 kg (120 lbs). That is a weight loss of 5.5 kg (9.2% body weight) over the past almost 7 months, which is not significant for time frame but is still concerning.  Medications reviewed and include: thiamine 100 mg daily, vitamin B12 1000 micrograms daily, LR at 100 mL/hr.  Labs reviewed: CBG 77-127, BUN 46, Creatinine 2.63. On 8/28 Iron, Folate, and vitamin B12 WNL.  Patient is at risk for malnutrition.  NUTRITION - FOCUSED PHYSICAL EXAM:   Most Recent Value  Orbital Region Mild depletion  Upper Arm Region Moderate depletion  Thoracic and Lumbar Region Mild depletion  Buccal Region Mild depletion  Temple Region No depletion  Clavicle Bone Region No depletion  Clavicle and Acromion Bone Region No depletion  Scapular Bone Region No depletion  Dorsal Hand Mild depletion  Patellar Region No depletion  Anterior Thigh Region No depletion  Posterior Calf Region No depletion  Edema (RD Assessment) None  Hair Reviewed  Eyes Reviewed  Mouth Reviewed  Skin Reviewed  Nails Reviewed     Diet Order:   Diet Order            Diet regular Room service appropriate? Yes; Fluid consistency: Thin  Diet effective now                EDUCATION NEEDS:   No education needs have been identified at this  time  Skin:  Skin Assessment: Reviewed RN Assessment  Last BM:  12/15/2019 - small type 7  Height:   Ht Readings from Last 1 Encounters:  12/14/19 5' 3"  (1.6 m)   Weight:   Wt Readings from Last 1 Encounters:  12/14/19 54.4 kg   BMI:  Body mass index is 21.26 kg/m.  Estimated Nutritional Needs:   Kcal:  1500-1700  Protein:  75-85 grams  Fluid:  1.5-1.7 L/day  Jacklynn Barnacle, MS, RD, LDN Pager number available on Amion

## 2019-12-17 NOTE — Progress Notes (Signed)
PROGRESS NOTE    Andrea Hobbs  VFI:[id number] DOB: 1955/11/07 DOA: 12/14/2019 PCP: Elisabeth Cara, NP   Chief complaint.  Confusion.  Brief Narrative:  64 year old female with history of bipolar disorder, opiate dependence and IV heroin use now on Suboxone, HCVs/pchemo, HTN, CKD-3Aand tobacco use disorder presenting with worsening confusion, ataxia, ambulatory dysfunction and frequent falls for the last 2 months. Reportedly started on gabapentin about 2 months ago by PCP. She also reports about 14 pound weight loss in the last 2 months, and55 pound weight loss in the last 2 years.  Patient also complaining of diarrhea for the last 2 days, multiple loose stools without nausea vomiting.  No abdominal pain.  Patient normally has a very poor appetite, but no chronic diarrhea.  She eats 1 meal a day.  She also has chronic insomnia.  Upon arriving the emergency room, she was found to have acute on chronic renal failure.  Renal ultrasound showed chronic kidney disease, no obstruction.  She is placed on IV fluids.  8/29.  Renal function is getting better today.  Diarrhea essentially resolved.  Change to bicarb drip due to metabolic acidosis.  8/30.  Renal function continued to improve.  Metabolic acidosis resolved.  Change fluids to lactated Ringer.    Assessment & Plan:   Active Problems:   Acute kidney injury superimposed on CKD (HCC)   CKD (chronic kidney disease) stage 3, GFR 30-59 ml/min   Bipolar disorder (HCC)   HTN (hypertension)   History of intravenous drug abuse ([place])   AKI (acute kidney injury) ([place])  #1.  Acute kidney injury on chronic kidney disease stage IIIa. With associated metabolic acidosis. Condition gradually improving.  Creatinine still high, not sure this is a new baseline, he did not have any recent labs for the last 2 years. I will continue some fluids with lactated Ringer for 24 hours.  Plan to discharge home tomorrow.  2.  Ataxia. Frequent  falls. Appear to be secondary to gabapentin.  3.  Unintentional weight loss. Will need a EGD as outpatient.  4.  Transient hypotension. Condition resolved.  ACTH stimulation test did not show adrenal insufficiency.  5.  Anemia with borderline B12 level. Homocystine level pending.  DVT prophylaxis: heparin Code Status: Full Family Communication: None Disposition Plan:   Patient came from:      Home                                                                                                           Anticipated d/c place: Home  Barriers to d/c OR conditions which need to be met to effect a safe d/c:   Consultants:   None  Procedures:None Antimicrobials: None    Subjective: Patient doing well today.  No confusion.  No nausea vomiting.  Normal bowel movement.  No fever or chills.  Objective: Vitals:   12/16/19 0432 12/16/19 1152 12/16/19 2258 12/17/19 0529  BP: 94/61 97/61 115/87 (!) 92/51  Pulse: 72 64 70 71  Resp:   20 20  Temp:  97.6 F (36.4  C) 98.3 F (36.8 C) 98.9 F (37.2 C)  TempSrc:  Oral Oral Oral  SpO2: 95% 93% 94% 92%  Weight:      Height:        Intake/Output Summary (Last 24 hours) at 12/17/2019 1156 Last data filed at 12/17/2019 1018 Gross per 24 hour  Intake 2040.47 ml  Output --  Net 2040.47 ml   Filed Weights   12/14/19 1753  Weight: 54.4 kg    Examination:  General exam: Appears calm and comfortable  Respiratory system: Clear to auscultation. Respiratory effort normal. Cardiovascular system: S1 & S2 heard, RRR. No JVD, murmurs, rubs, gallops or clicks. No pedal edema. Gastrointestinal system: Abdomen is nondistended, soft and nontender. No organomegaly or masses felt. Normal bowel sounds heard. Central nervous system: Alert and oriented. No focal neurological deficits. Extremities: Symmetric 5 x 5 power. Skin: No rashes, lesions or ulcers Psychiatry:  Mood & affect appropriate.     Data Reviewed: I have personally  reviewed following labs and imaging studies  CBC: Recent Labs  Lab 12/14/19 1811 12/15/19 0223 12/15/19 0355 12/16/19 0542 12/17/19 0634  WBC 8.6 8.0 8.0 7.2 9.2  NEUTROABS 5.2  --   --   --  4.4  HGB 9.9* 10.5* 9.8* 8.6* 8.9*  HCT 31.0* 32.8* 30.5* 25.0* 26.8*  MCV 94.2 94.8 95.0 90.6 92.7  PLT 293 277 244 230 478   Basic Metabolic Panel: Recent Labs  Lab 12/14/19 1811 12/14/19 2106 12/15/19 0223 12/15/19 0355 12/16/19 0542 12/17/19 0634  NA 136  --   --  140 139 139  K 4.7  --   --  4.5 4.0 3.7  CL 104  --   --  112* 114* 107  CO2 19*  --   --  19* 18* 27  GLUCOSE 99  --   --  67* 116* 90  BUN 73*  --   --  65* 55* 46*  CREATININE 6.63*  --  6.17* 5.70* 3.71* 2.63*  CALCIUM 8.8*  --   --  7.8* 7.8* 8.1*  MG  --  1.9  --   --  1.5* 2.2  PHOS  --   --   --   --  4.7* 3.4   GFR: Estimated Creatinine Clearance: 18.3 mL/min (A) (by C-G formula based on SCr of 2.63 mg/dL (H)). Liver Function Tests: Recent Labs  Lab 12/14/19 1811 12/16/19 0542  AST 17  --   ALT 14  --   ALKPHOS 38  --   BILITOT 0.7  --   PROT 7.9  --   ALBUMIN 3.8 2.5*   No results for input(s): LIPASE, AMYLASE in the last 168 hours. No results for input(s): AMMONIA in the last 168 hours. Coagulation Profile: No results for input(s): INR, PROTIME in the last 168 hours. Cardiac Enzymes: No results for input(s): CKTOTAL, CKMB, CKMBINDEX, TROPONINI in the last 168 hours. BNP (last 3 results) No results for input(s): PROBNP in the last 8760 hours. HbA1C: Recent Labs    12/15/19 0355  HGBA1C 6.3*   CBG: Recent Labs  Lab 12/16/19 0026 12/16/19 0750 12/16/19 1615 12/16/19 2350 12/17/19 0821  GLUCAP 73 127* 124* 77 99   Lipid Profile: No results for input(s): CHOL, HDL, LDLCALC, TRIG, CHOLHDL, LDLDIRECT in the last 72 hours. Thyroid Function Tests: Recent Labs    12/15/19 0355  TSH 1.812   Anemia Panel: Recent Labs    12/15/19 0223  VITAMINB12 276  FOLATE 17.7  FERRITIN 148  TIBC 288  IRON 59   Sepsis Labs: Recent Labs  Lab 12/14/19 2106  LATICACIDVEN 0.6    Recent Results (from the past 240 hour(s))  SARS Coronavirus 2 by RT PCR (hospital order, performed in [facility] hospital lab) Nasopharyngeal Nasopharyngeal Swab     Status: None   Collection Time: 12/14/19 10:22 PM   Specimen: Nasopharyngeal Swab  Result Value Ref Range Status   SARS Coronavirus 2 NEGATIVE NEGATIVE Final    Comment: (NOTE) SARS-CoV-2 target nucleic acids are NOT DETECTED.  The SARS-CoV-2 RNA is generally detectable in upper and lower respiratory specimens during the acute phase of infection. The lowest concentration of SARS-CoV-2 viral copies this assay can detect is 250 copies / mL. A negative result does not preclude SARS-CoV-2 infection and should not be used as the sole basis for treatment or other patient management decisions.  A negative result may occur with improper specimen collection / handling, submission of specimen other than nasopharyngeal swab, presence of viral mutation(s) within the areas targeted by this assay, and inadequate number of viral copies (<250 copies / mL). A negative result must be combined with clinical observations, patient history, and epidemiological information.  Fact Sheet for Patients:   StrictlyIdeas.no  Fact Sheet for Healthcare Providers: BankingDealers.co.za  This test is not yet approved or  cleared by the [place] FDA and has been authorized for detection and/or diagnosis of SARS-CoV-2 by FDA under an Emergency Use Authorization (EUA).  This EUA will remain in effect (meaning this test can be used) for the duration of the COVID-19 declaration under Section 564(b)(1) of the Act, 21 U.S.C. section 360bbb-3(b)(1), unless the authorization is terminated or revoked sooner.  Performed at [facility], [address] [place], [place] [address]          Radiology  Studies: No results found.      Scheduled Meds: . buprenorphine  8 mg Sublingual BID  . FLUoxetine  60 mg Oral Daily  . heparin  5,000 Units Subcutaneous Q8H  . nicotine  21 mg Transdermal Daily  . thiamine  100 mg Oral Daily  . traZODone  100 mg Oral QHS  . vitamin B-12  1,000 mcg Oral Daily   Continuous Infusions: . lactated ringers 100 mL/hr at 12/17/19 1020     LOS: 3 days    Time spent: 25 minutes    Sharen Hones, MD [facility]   To contact the attending provider between 7A-7P or the covering provider during after hours 7P-7A, please log into the web site [email or web address] and access using universal [place] password for that web site. If you do not have the password, please call the hospital operator.  12/17/2019, 11:56 AM

## 2019-12-18 LAB — BASIC METABOLIC PANEL
Anion gap: 7 (ref 5–15)
BUN: 47 mg/dL — ABNORMAL HIGH (ref 8–23)
CO2: 24 mmol/L (ref 22–32)
Calcium: 8.4 mg/dL — ABNORMAL LOW (ref 8.9–10.3)
Chloride: 108 mmol/L (ref 98–111)
Creatinine, Ser: 1.85 mg/dL — ABNORMAL HIGH (ref 0.44–1.00)
GFR calc Af Amer: 33 mL/min — ABNORMAL LOW (ref >60)
GFR calc non Af Amer: 29 mL/min — ABNORMAL LOW (ref >60)
Glucose, Bld: 83 mg/dL (ref 70–99)
Potassium: 4.3 mmol/L (ref 3.5–5.1)
Sodium: 139 mmol/L (ref 135–145)

## 2019-12-18 LAB — MAGNESIUM: Magnesium: 1.7 mg/dL (ref 1.7–2.4)

## 2019-12-18 LAB — GLUCOSE, CAPILLARY: Glucose-Capillary: 79 mg/dL (ref 70–99)

## 2019-12-18 LAB — HOMOCYSTEINE: Homocysteine: 23.1 umol/L — ABNORMAL HIGH (ref 0.0–17.2)

## 2019-12-18 MED ORDER — THIAMINE HCL 100 MG PO TABS: 100.0000 mg | ORAL_TABLET | Freq: Every day | ORAL | 0 refills | Status: AC

## 2019-12-18 MED ORDER — SALINE SPRAY 0.65 % NA SOLN
1.0000 | NASAL | Status: DC | PRN
  Administered 2019-12-18: 1 via NASAL
  Filled 2019-12-18 (×2): qty 44

## 2019-12-18 MED ORDER — CYANOCOBALAMIN 1000 MCG PO TABS: 1000.0000 ug | ORAL_TABLET | Freq: Every day | ORAL | 0 refills | Status: AC

## 2019-12-18 NOTE — Discharge Summary (Signed)
Physician Discharge Summary  Patient ID: Andrea Hobbs MRN: [id number] DOB/AGE: 1955/05/12 64 y.o.  Admit date: 12/14/2019 Discharge date: 12/18/2019  Admission Diagnoses:  Discharge Diagnoses:  Active Problems:   Acute kidney injury superimposed on CKD (HCC)   CKD (chronic kidney disease) stage 3, GFR 30-59 ml/min   Bipolar disorder (HCC)   HTN (hypertension)   History of intravenous drug abuse ([place])   AKI (acute kidney injury) ([place])   Discharged Condition: good  Hospital Course:  64 year old female with history of bipolar disorder, opiate dependence and IV heroin use now on Suboxone, HCVs/pchemo, HTN, CKD-3Aand tobacco use disorder presenting with worsening confusion, ataxia, ambulatory dysfunction and frequent falls for the last 2 months. Reportedly started on gabapentin about 2 months ago by PCP. She also reports about 14 pound weight loss in the last 2 months, and55 pound weight loss in the last 2 years.  Patient also complaining of diarrhea for the last 2 days, multiple loose stools without nausea vomiting. No abdominal pain. Patient normally has a very poor appetite, but no chronic diarrhea. She eats 1 meal a day. She also has chronic insomnia.  Upon arriving the emergency room, she was found to have acute on chronic renal failure. Renal ultrasound showed chronic kidney disease, no obstruction. She is placed on IV fluids.  8/29.Renal function is getting better today. Diarrhea essentially resolved. Change to bicarb drip due to metabolic acidosis.  8/30.  Renal function continued to improve.  Metabolic acidosis resolved.  Change fluids to lactated Ringer.   #1.  Acute kidney injury on chronic kidney disease stage IIIa. With associated metabolic acidosis. Condition gradually improved.  Renal function close to baseline.  She is encouraged to take p.o. intake.  She does not have any diarrhea.  2.  Ataxia. Frequent falls. Appear to be secondary to  gabapentin.  3.  Unintentional weight loss. Will need a EGD as outpatient.  4.  Transient hypotension. Condition resolved.  ACTH stimulation test did not show adrenal insufficiency.  5.  Anemia with borderline B12 level. B12 supplement started.  Consults: None  Significant Diagnostic Studies:   RENAL / URINARY TRACT ULTRASOUND COMPLETE  COMPARISON:  None.  FINDINGS: Right Kidney:  Renal measurements: 9.0 x 4.7 x 5.1 cm = volume: 114 mL. Increased cortical echogenicity. No mass or hydronephrosis visualized.  Left Kidney:  Renal measurements: 11.0 x 6.7 x 5.0 cm = volume: 197 mL. Increased cortical echogenicity. No mass or hydronephrosis visualized.  Bladder:  Partially collapsed bladder with bladder wall thickening and trabeculation.  Other:  None.  IMPRESSION: 1. No hydronephrosis. 2. Bilateral echogenic kidneys compatible with chronic medical renal disease.   Electronically Signed   By: Kerby Moors M.D.   On: 12/15/2019 10:24  CT HEAD WITHOUT CONTRAST  TECHNIQUE: Contiguous axial images were obtained from the base of the skull through the vertex without intravenous contrast.  COMPARISON:  None.  FINDINGS: Brain: There is mild cerebral atrophy with widening of the extra-axial spaces and ventricular dilatation. There are areas of decreased attenuation within the white matter tracts of the supratentorial brain, consistent with microvascular disease changes.  Vascular: No hyperdense vessel or unexpected calcification.  Skull: Normal. Negative for fracture or focal lesion.  Sinuses/Orbits: No acute finding.  Other: Mild scalp soft tissue swelling is seen along the posterior aspect of the vertex on the right.  IMPRESSION: 1. Generalized cerebral atrophy. 2. No acute intracranial abnormality.   Electronically Signed   By: Virgina Norfolk M.D.   On: 12/14/2019  18:51  Treatments: iv fluids  Discharge Exam: Blood  pressure (!) 132/99, pulse 71, temperature 99.9 F (37.7 C), temperature source Oral, resp. rate 20, height 5\' 3"  (1.6 m), weight 54.4 kg, SpO2 95 %. General appearance: alert and cooperative Resp: clear to auscultation bilaterally Cardio: regular rate and rhythm, S1, S2 normal, no murmur, click, rub or gallop GI: soft, non-tender; bowel sounds normal; no masses,  no organomegaly Extremities: extremities normal, atraumatic, no cyanosis or edema  Disposition: Discharge disposition: 01-Home or Self Care       Discharge Instructions    Diet - low sodium heart healthy   Complete by: As directed    Increase activity slowly   Complete by: As directed      Allergies as of 12/18/2019   No Known Allergies     Medication List    STOP taking these medications   furosemide 20 MG tablet Commonly known as: LASIX   gabapentin 600 MG tablet Commonly known as: NEURONTIN   lisinopril 5 MG tablet Commonly known as: ZESTRIL     TAKE these medications   albuterol 108 (90 Base) MCG/ACT inhaler Commonly known as: VENTOLIN HFA Inhale 2 puffs into the lungs every 4 (four) hours as needed for wheezing or shortness of breath.   cyanocobalamin 1000 MCG tablet Take 1 tablet (1,000 mcg total) by mouth daily. Start taking on: December 19, 2019   FLUoxetine 20 MG capsule Commonly known as: PROZAC Take 60 mg by mouth daily.   potassium chloride SA 20 MEQ tablet Commonly known as: KLOR-CON Take 20 mEq by mouth daily.   QUEtiapine 100 MG tablet Commonly known as: SEROQUEL Take 50-100 mg by mouth at bedtime as needed.   Suboxone 8-2 MG Film Generic drug: Buprenorphine HCl-Naloxone HCl Take 1 Film by mouth daily.   thiamine 100 MG tablet Take 1 tablet (100 mg total) by mouth daily. Start taking on: December 19, 2019   traZODone 100 MG tablet Commonly known as: DESYREL Take 100 mg by mouth at bedtime.       Follow-up Information    Elisabeth Cara, NP Follow up in 1 week(s).    Specialty: Nurse Practitioner Contact information: [place] [address] [phone]        Jonathon Bellows, MD Follow up in 4 week(s).   Specialty: Gastroenterology Contact information: [place] [address] [phone]               Signed: Sharen Hones 12/18/2019, 10:34 AM

## 2020-05-29 ENCOUNTER — Telehealth: Payer: Self-pay | Admitting: *Deleted

## 2020-05-29 NOTE — Telephone Encounter (Signed)
Attempted to contact and schedule lung screening scan. Message left for patient to call back to schedule. 

## 2020-06-13 ENCOUNTER — Telehealth: Payer: Self-pay | Admitting: Licensed Clinical Social Worker

## 2020-06-13 NOTE — Telephone Encounter (Signed)
Left message for patient to schedule CT scan for lung screening program.

## 2020-06-19 ENCOUNTER — Telehealth: Payer: Self-pay | Admitting: *Deleted

## 2020-06-19 NOTE — Telephone Encounter (Signed)
Attempted to contact patient for scheduling lung ct screening scan. Left msg for patient to call [phone] to schedule ct scan.

## 2020-07-24 ENCOUNTER — Encounter: Payer: Self-pay | Admitting: *Deleted

## 2020-10-21 ENCOUNTER — Telehealth: Payer: Self-pay

## 2020-10-21 NOTE — Telephone Encounter (Signed)
-----   Message from Virgel Manifold, MD sent at 10/21/2020 11:33 AM EDT ----- Robet Leu, this patient was supposed to have a repeat colonoscopy in 3 years from her last one in 2019.  This is for history of polyps.  Can you please call her to schedule this.  If she would not like to schedule at this time, please advise her to make a clinic appointment to discuss further.

## 2020-10-23 NOTE — Telephone Encounter (Signed)
Left message on voicemail asking pt to return call to schedule colonoscopy or OV to discuss if does not want to schedule at this time

## 2020-10-24 NOTE — Telephone Encounter (Signed)
Left message on voicemail  Reminder letter mailed

## 2022-02-22 IMAGING — US US RENAL
1 series · 14 of 25 positions shown · non-contrast
Comparison: None.

CLINICAL DATA: Acute kidney injury

EXAM:
RENAL / URINARY TRACT ULTRASOUND COMPLETE

[Series 1: us renal · 14 of 33 slices shown]
[im 1/33]
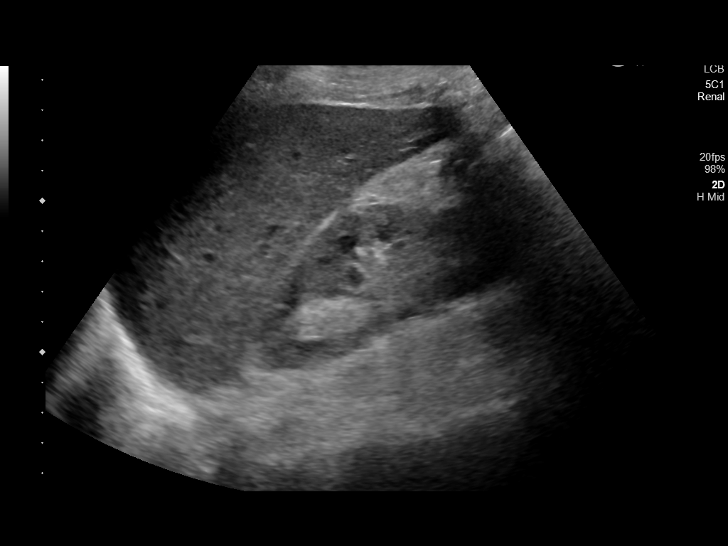
[im 3/33]
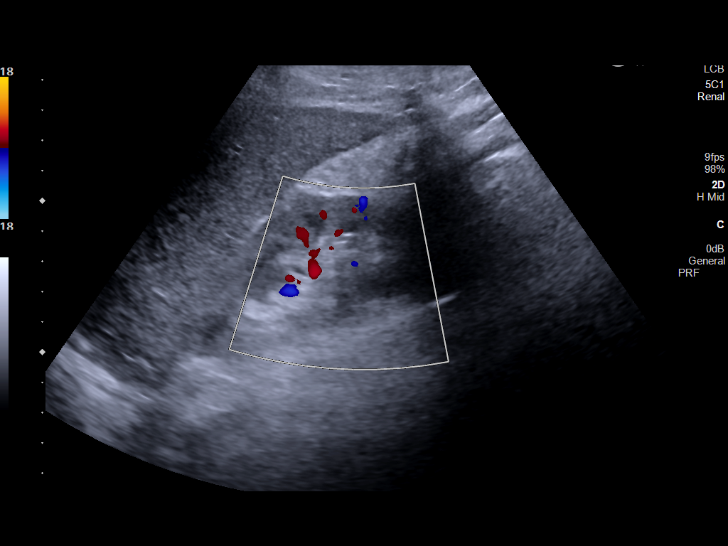
[im 6/33]
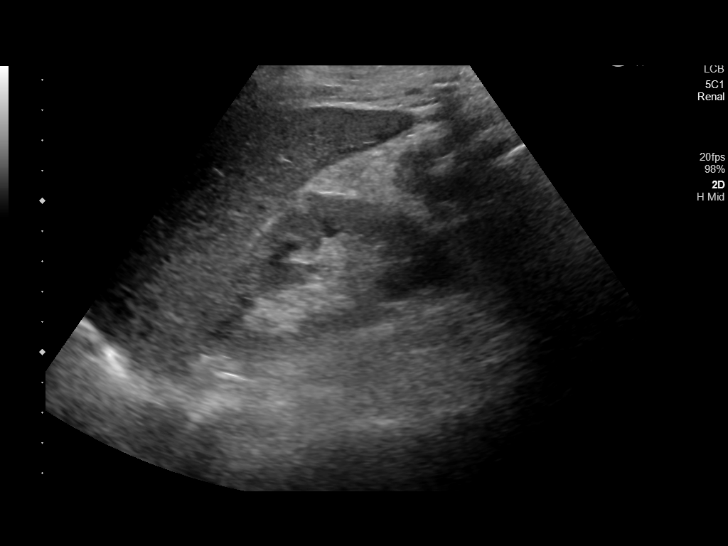
[im 9/33]
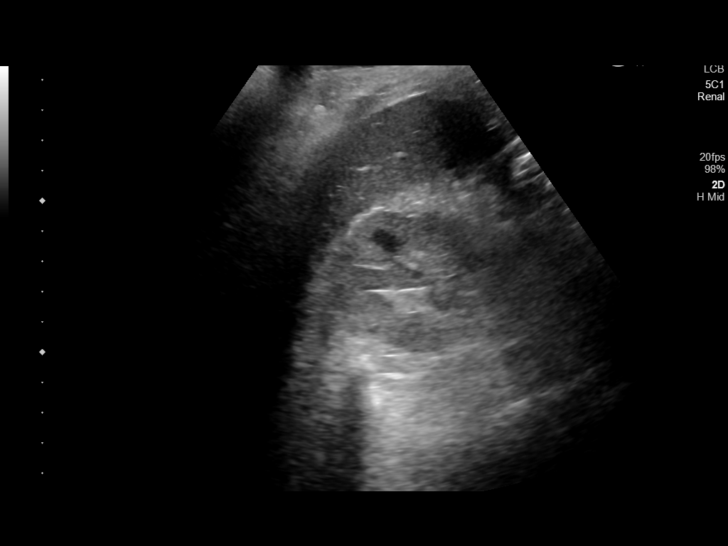
[im 11/33]
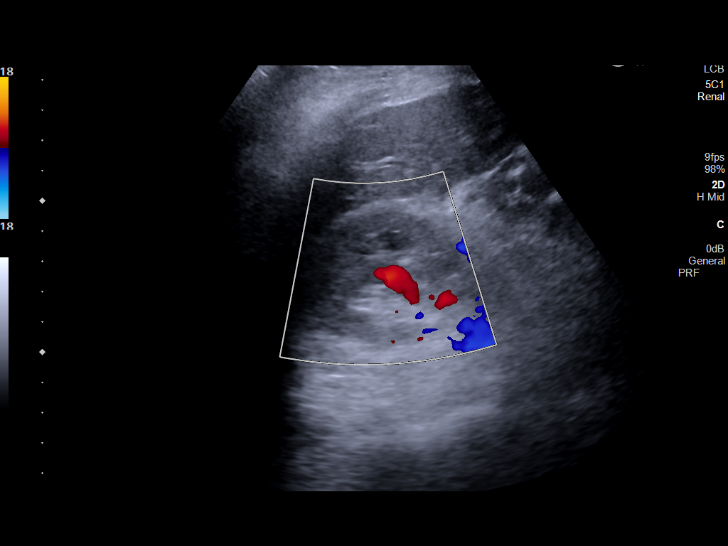
[im 13/33]
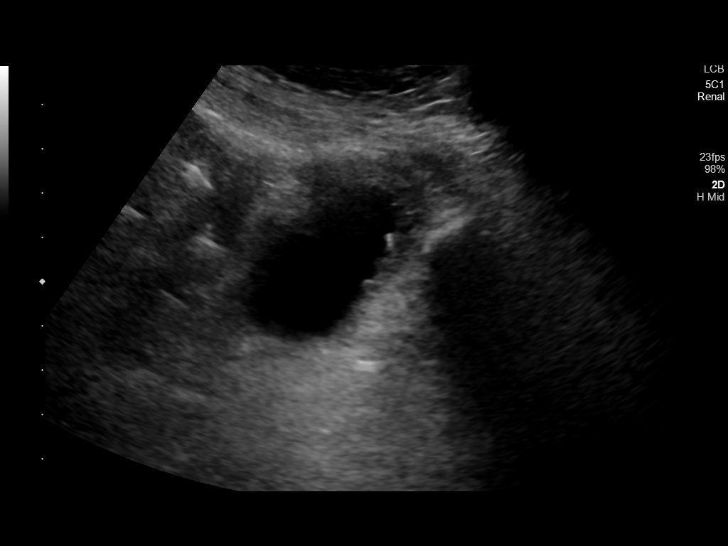
[im 15/33]
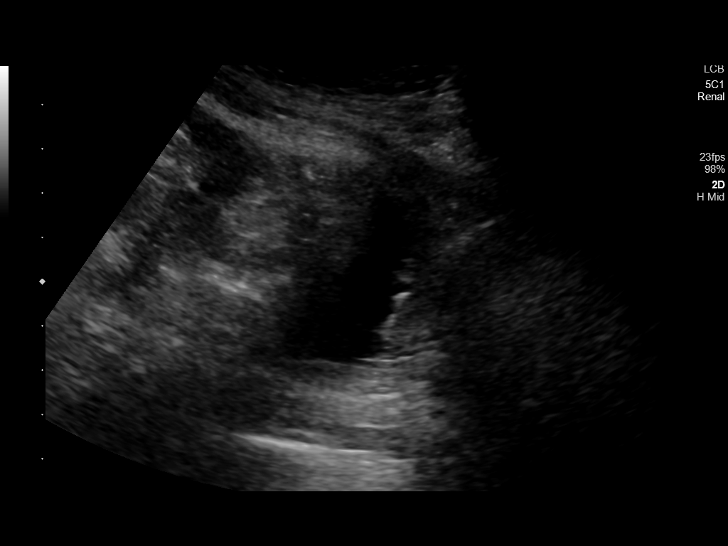
[im 18/33]
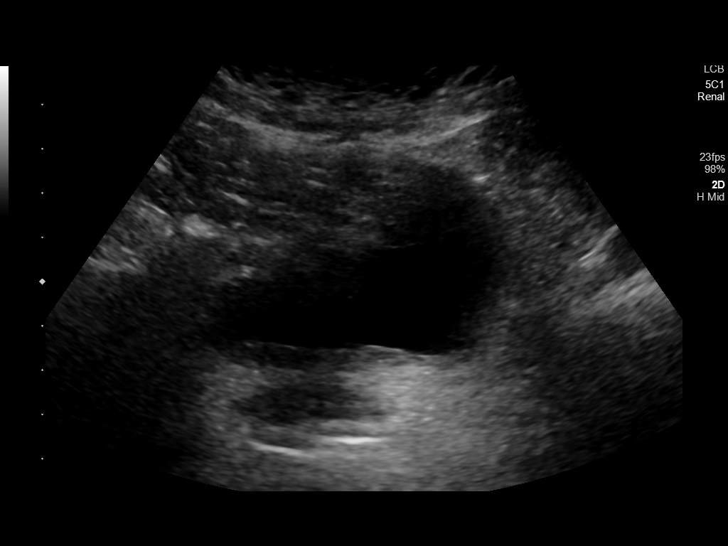
[im 21/33]
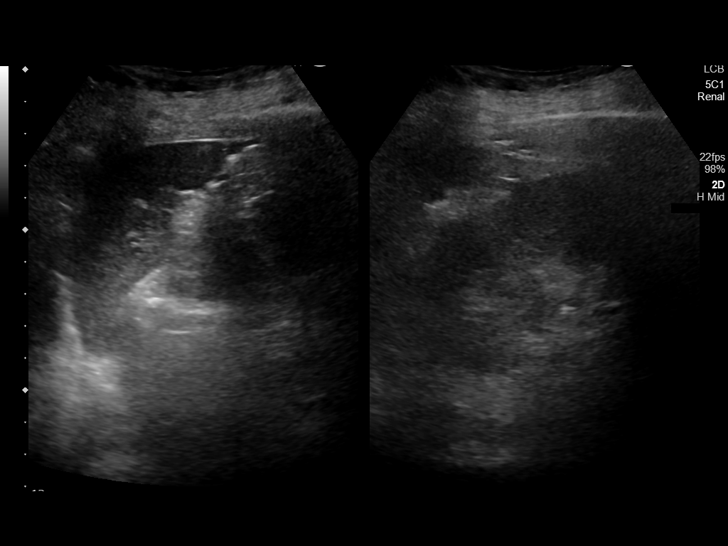
[im 22/33]
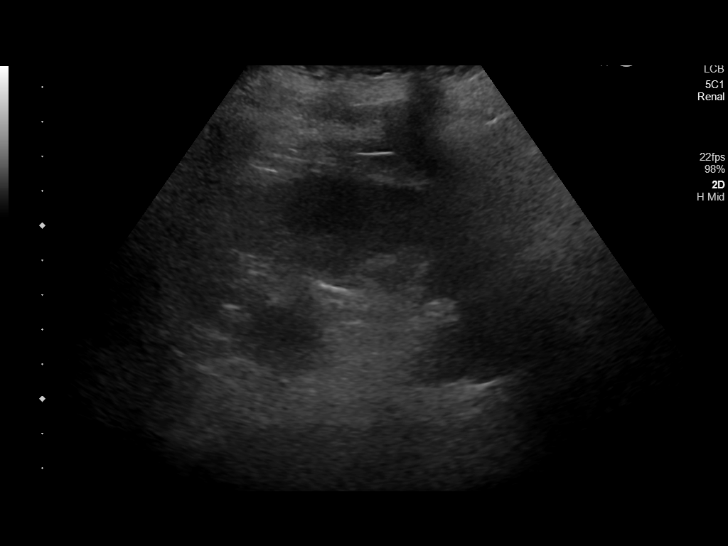
[im 25/33]
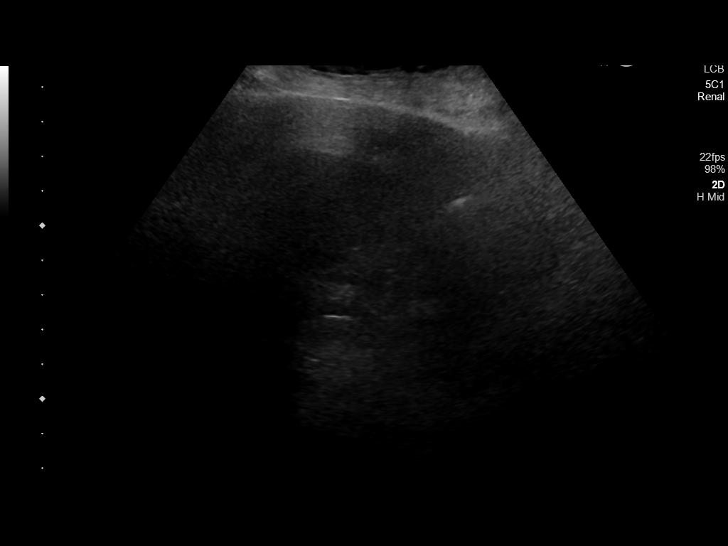
[im 27/33]
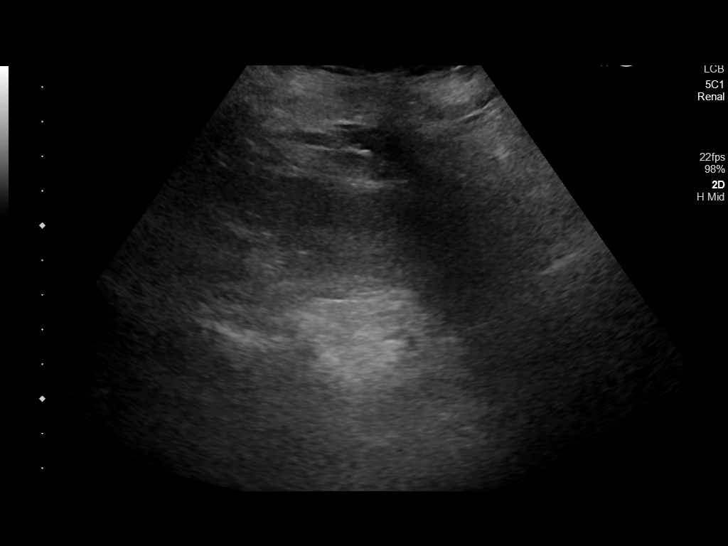
[im 30/33]
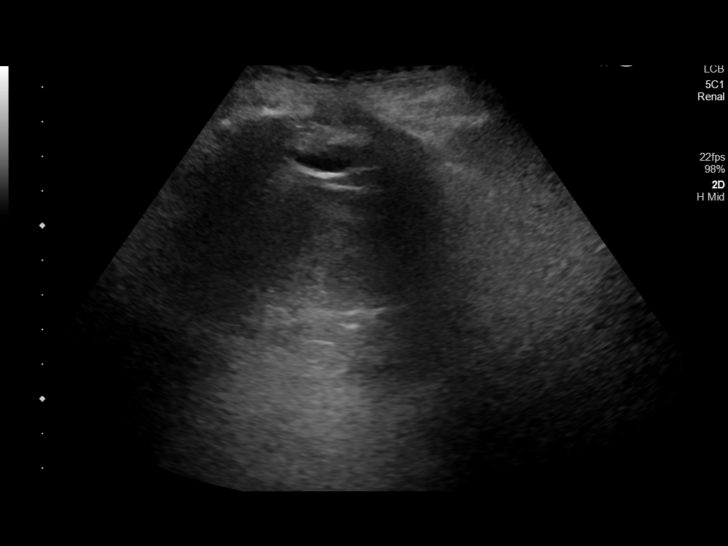
[im 33/33]
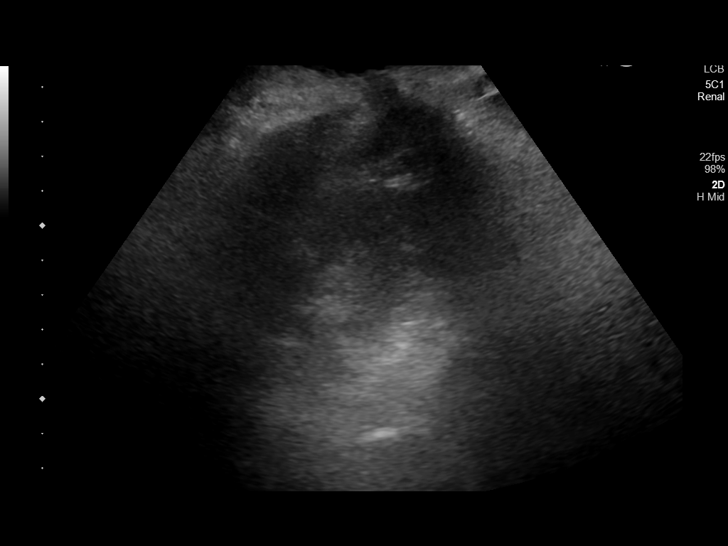

[14 of 25 positions shown; findings below may reference images not displayed]

FINDINGS: Right Kidney:

Renal measurements: 9.0 x 4.7 x 5.1 cm = volume: 114 mL. Increased
cortical echogenicity. No mass or hydronephrosis visualized.

Left Kidney:

Renal measurements: 11.0 x 6.7 x 5.0 cm = volume: 197 mL. Increased
cortical echogenicity. No mass or hydronephrosis visualized.

Bladder:

Partially collapsed bladder with bladder wall thickening and
trabeculation.

Other:

None.
IMPRESSION: 1. No hydronephrosis.
2. Bilateral echogenic kidneys compatible with chronic medical renal
disease.

## 2022-12-08 LAB — LAB REPORT - SCANNED
A1c: 6.1
EGFR: 65

## 2022-12-09 ENCOUNTER — Other Ambulatory Visit: Payer: Self-pay | Admitting: Family Medicine

## 2022-12-09 DIAGNOSIS — Z1231 Encounter for screening mammogram for malignant neoplasm of breast: Secondary | ICD-10-CM

## 2023-01-11 ENCOUNTER — Encounter: Payer: Self-pay | Admitting: *Deleted

## 2023-11-04 ENCOUNTER — Other Ambulatory Visit: Payer: Self-pay | Admitting: Family Medicine

## 2023-11-04 DIAGNOSIS — Z13820 Encounter for screening for osteoporosis: Secondary | ICD-10-CM

## 2023-11-04 DIAGNOSIS — Z1231 Encounter for screening mammogram for malignant neoplasm of breast: Secondary | ICD-10-CM

## 2023-11-04 DIAGNOSIS — Z122 Encounter for screening for malignant neoplasm of respiratory organs: Secondary | ICD-10-CM

## 2023-11-10 ENCOUNTER — Ambulatory Visit
Admission: RE | Admit: 2023-11-10 | Discharge: 2023-11-10 | Disposition: A | Discharge status: Home / Self Care | Source: Ambulatory Visit | Attending: Family Medicine | Admitting: Family Medicine

## 2023-11-10 DIAGNOSIS — F17210 Nicotine dependence, cigarettes, uncomplicated: Secondary | ICD-10-CM | POA: Diagnosis not present

## 2023-11-10 DIAGNOSIS — Z122 Encounter for screening for malignant neoplasm of respiratory organs: Secondary | ICD-10-CM | POA: Insufficient documentation

## 2023-11-10 DIAGNOSIS — I2510 Atherosclerotic heart disease of native coronary artery without angina pectoris: Secondary | ICD-10-CM | POA: Diagnosis not present

## 2023-11-10 DIAGNOSIS — I700 Atherosclerosis of aorta: Secondary | ICD-10-CM | POA: Diagnosis not present

## 2023-11-10 DIAGNOSIS — J439 Emphysema, unspecified: Secondary | ICD-10-CM | POA: Insufficient documentation
